# Patient Record
Sex: Male | Born: 1960 | Race: White | Hispanic: No | Marital: Married | Smoking: Former smoker
Health system: Southern US, Community
[De-identification: ages and names within clinical notes are randomized; demographics above are authoritative.]

## PROBLEM LIST (undated history)

## (undated) DIAGNOSIS — F329 Major depressive disorder, single episode, unspecified: Secondary | ICD-10-CM

## (undated) DIAGNOSIS — G249 Dystonia, unspecified: Secondary | ICD-10-CM

## (undated) DIAGNOSIS — I4510 Unspecified right bundle-branch block: Secondary | ICD-10-CM

## (undated) DIAGNOSIS — G4730 Sleep apnea, unspecified: Secondary | ICD-10-CM

## (undated) DIAGNOSIS — E669 Obesity, unspecified: Secondary | ICD-10-CM

## (undated) DIAGNOSIS — I10 Essential (primary) hypertension: Secondary | ICD-10-CM

## (undated) DIAGNOSIS — R251 Tremor, unspecified: Secondary | ICD-10-CM

## (undated) DIAGNOSIS — E559 Vitamin D deficiency, unspecified: Secondary | ICD-10-CM

## (undated) DIAGNOSIS — E119 Type 2 diabetes mellitus without complications: Secondary | ICD-10-CM

## (undated) DIAGNOSIS — M1990 Unspecified osteoarthritis, unspecified site: Secondary | ICD-10-CM

## (undated) DIAGNOSIS — F419 Anxiety disorder, unspecified: Secondary | ICD-10-CM

## (undated) DIAGNOSIS — R413 Other amnesia: Secondary | ICD-10-CM

## (undated) DIAGNOSIS — R269 Unspecified abnormalities of gait and mobility: Secondary | ICD-10-CM

## (undated) DIAGNOSIS — M76821 Posterior tibial tendinitis, right leg: Secondary | ICD-10-CM

## (undated) DIAGNOSIS — K9089 Other intestinal malabsorption: Secondary | ICD-10-CM

## (undated) DIAGNOSIS — M25871 Other specified joint disorders, right ankle and foot: Secondary | ICD-10-CM

## (undated) DIAGNOSIS — M5417 Radiculopathy, lumbosacral region: Secondary | ICD-10-CM

## (undated) DIAGNOSIS — E079 Disorder of thyroid, unspecified: Secondary | ICD-10-CM

## (undated) DIAGNOSIS — F32A Depression, unspecified: Secondary | ICD-10-CM

## (undated) DIAGNOSIS — K219 Gastro-esophageal reflux disease without esophagitis: Secondary | ICD-10-CM

## (undated) DIAGNOSIS — K909 Intestinal malabsorption, unspecified: Secondary | ICD-10-CM

## (undated) DIAGNOSIS — E785 Hyperlipidemia, unspecified: Secondary | ICD-10-CM

## (undated) DIAGNOSIS — D649 Anemia, unspecified: Secondary | ICD-10-CM

## (undated) HISTORY — PX: COLONOSCOPY: SHX174

## (undated) HISTORY — PX: ESOPHAGOGASTRODUODENOSCOPY: SHX1529

## (undated) HISTORY — PX: BACK SURGERY: SHX140

## (undated) HISTORY — PX: KNEE SURGERY: SHX244

## (undated) HISTORY — PX: CHOLECYSTECTOMY: SHX55

## (undated) HISTORY — PX: ANTERIOR FUSION CERVICAL SPINE: SUR626

---

## 2006-04-12 ENCOUNTER — Emergency Department: Payer: Self-pay | Admitting: Emergency Medicine

## 2006-08-04 ENCOUNTER — Emergency Department: Payer: Self-pay | Admitting: Emergency Medicine

## 2007-03-04 ENCOUNTER — Ambulatory Visit: Payer: Self-pay | Admitting: Gastroenterology

## 2007-03-16 ENCOUNTER — Ambulatory Visit: Payer: Self-pay | Admitting: Gastroenterology

## 2007-11-01 ENCOUNTER — Emergency Department: Payer: Self-pay | Admitting: Emergency Medicine

## 2009-04-14 ENCOUNTER — Ambulatory Visit: Payer: Self-pay | Admitting: Family Medicine

## 2010-02-27 ENCOUNTER — Ambulatory Visit: Payer: Self-pay | Admitting: Orthopedic Surgery

## 2010-10-19 ENCOUNTER — Ambulatory Visit: Payer: Self-pay

## 2010-11-09 ENCOUNTER — Ambulatory Visit: Payer: Self-pay

## 2010-12-09 ENCOUNTER — Ambulatory Visit: Payer: Self-pay

## 2011-06-27 ENCOUNTER — Ambulatory Visit: Payer: Self-pay | Admitting: General Practice

## 2011-06-27 LAB — CREATININE, SERUM: Creatinine: 1.92 mg/dL — ABNORMAL HIGH (ref 0.60–1.30)

## 2011-07-19 ENCOUNTER — Other Ambulatory Visit: Payer: Self-pay | Admitting: Gastroenterology

## 2011-07-19 LAB — CLOSTRIDIUM DIFFICILE BY PCR

## 2011-07-21 LAB — STOOL CULTURE

## 2011-07-25 LAB — WBCS, STOOL

## 2011-08-28 ENCOUNTER — Emergency Department: Payer: Self-pay | Admitting: Emergency Medicine

## 2011-09-03 ENCOUNTER — Ambulatory Visit: Payer: Self-pay | Admitting: Gastroenterology

## 2011-09-09 LAB — PATHOLOGY REPORT

## 2011-09-26 ENCOUNTER — Ambulatory Visit: Payer: Self-pay | Admitting: General Practice

## 2011-10-16 ENCOUNTER — Ambulatory Visit: Payer: Self-pay | Admitting: Gastroenterology

## 2011-10-16 LAB — CREATININE, SERUM
Creatinine: 1.1 mg/dL (ref 0.60–1.30)
EGFR (African American): >60
EGFR (Non-African Amer.): >60

## 2011-10-25 ENCOUNTER — Ambulatory Visit: Payer: Self-pay | Admitting: Oncology

## 2011-10-25 LAB — CBC CANCER CENTER
Basophil #: 0.1 x10 3/mm (ref 0.0–0.1)
Basophil %: 1.2 %
Eosinophil %: 6.1 %
HCT: 37.3 % — ABNORMAL LOW (ref 40.0–52.0)
Lymphocyte %: 29.6 %
MCHC: 32.6 g/dL (ref 32.0–36.0)
Monocyte #: 0.4 x10 3/mm (ref 0.2–1.0)
Neutrophil %: 56.3 %
RBC: 4.21 10*6/uL — ABNORMAL LOW (ref 4.40–5.90)
RDW: 13.8 % (ref 11.5–14.5)

## 2011-11-09 ENCOUNTER — Ambulatory Visit: Payer: Self-pay | Admitting: Oncology

## 2011-11-13 ENCOUNTER — Ambulatory Visit: Payer: Self-pay | Admitting: Gastroenterology

## 2011-12-16 ENCOUNTER — Ambulatory Visit: Payer: Self-pay | Admitting: Neurology

## 2011-12-17 ENCOUNTER — Encounter: Payer: Self-pay | Admitting: Neurology

## 2012-01-09 ENCOUNTER — Encounter: Payer: Self-pay | Admitting: Neurology

## 2012-02-09 ENCOUNTER — Encounter: Payer: Self-pay | Admitting: Neurology

## 2012-03-10 ENCOUNTER — Encounter: Payer: Self-pay | Admitting: Neurology

## 2012-08-23 ENCOUNTER — Emergency Department: Payer: Self-pay | Admitting: Unknown Physician Specialty

## 2013-05-01 ENCOUNTER — Emergency Department: Payer: Self-pay | Admitting: Emergency Medicine

## 2013-05-01 LAB — COMPREHENSIVE METABOLIC PANEL
Anion Gap: 7 (ref 7–16)
Bilirubin,Total: 0.4 mg/dL (ref 0.2–1.0)
Calcium, Total: 8.2 mg/dL — ABNORMAL LOW (ref 8.5–10.1)
Co2: 25 mmol/L (ref 21–32)
Creatinine: 1.31 mg/dL — ABNORMAL HIGH (ref 0.60–1.30)
EGFR (Non-African Amer.): >60
Glucose: 174 mg/dL — ABNORMAL HIGH (ref 65–99)
Osmolality: 281 (ref 275–301)
Potassium: 3.2 mmol/L — ABNORMAL LOW (ref 3.5–5.1)
SGOT(AST): 29 U/L (ref 15–37)
SGPT (ALT): 26 U/L (ref 12–78)
Sodium: 138 mmol/L (ref 136–145)

## 2013-05-01 LAB — PROTIME-INR
INR: 1
Prothrombin Time: 13 secs (ref 11.5–14.7)

## 2013-05-01 LAB — CBC
HCT: 36.5 % — ABNORMAL LOW (ref 40.0–52.0)
MCH: 29.8 pg (ref 26.0–34.0)
MCHC: 34.2 g/dL (ref 32.0–36.0)
RBC: 4.19 10*6/uL — ABNORMAL LOW (ref 4.40–5.90)
WBC: 3.6 10*3/uL — ABNORMAL LOW (ref 3.8–10.6)

## 2013-05-01 LAB — CK TOTAL AND CKMB (NOT AT ARMC): CK-MB: 2.6 ng/mL (ref 0.5–3.6)

## 2013-08-30 ENCOUNTER — Ambulatory Visit: Payer: Self-pay | Admitting: Oncology

## 2013-09-07 ENCOUNTER — Ambulatory Visit: Payer: Self-pay | Admitting: Oncology

## 2013-09-08 ENCOUNTER — Ambulatory Visit: Payer: Self-pay | Admitting: Oncology

## 2013-09-09 LAB — CBC CANCER CENTER
BASOS ABS: 0.1 x10 3/mm (ref 0.0–0.1)
BASOS PCT: 1.5 %
EOS ABS: 0.4 x10 3/mm (ref 0.0–0.7)
Eosinophil %: 7.7 %
HCT: 38.2 % — ABNORMAL LOW (ref 40.0–52.0)
HGB: 12.8 g/dL — ABNORMAL LOW (ref 13.0–18.0)
Lymphocyte #: 2.1 x10 3/mm (ref 1.0–3.6)
Lymphocyte %: 41.1 %
MCH: 29.5 pg (ref 26.0–34.0)
MCHC: 33.5 g/dL (ref 32.0–36.0)
MCV: 88 fL (ref 80–100)
MONO ABS: 0.4 x10 3/mm (ref 0.2–1.0)
Monocyte %: 8.3 %
Neutrophil #: 2.1 x10 3/mm (ref 1.4–6.5)
Neutrophil %: 41.4 %
Platelet: 227 x10 3/mm (ref 150–440)
RBC: 4.33 10*6/uL — ABNORMAL LOW (ref 4.40–5.90)
RDW: 14 % (ref 11.5–14.5)
WBC: 5 x10 3/mm (ref 3.8–10.6)

## 2013-09-09 LAB — FOLATE: Folic Acid: 32.9 ng/mL (ref 3.1–100.0)

## 2013-09-09 LAB — IRON AND TIBC
Iron Bind.Cap.(Total): 396 ug/dL (ref 250–450)
Iron Saturation: 22 %
Iron: 88 ug/dL (ref 65–175)
UNBOUND IRON-BIND. CAP.: 308 ug/dL

## 2013-09-09 LAB — LACTATE DEHYDROGENASE: LDH: 144 U/L (ref 85–241)

## 2013-09-09 LAB — FERRITIN: Ferritin (ARMC): 71 ng/mL (ref 8–388)

## 2013-09-15 ENCOUNTER — Ambulatory Visit: Payer: Self-pay | Admitting: Family Medicine

## 2013-10-08 ENCOUNTER — Ambulatory Visit: Payer: Self-pay | Admitting: Family Medicine

## 2013-10-08 ENCOUNTER — Ambulatory Visit: Payer: Self-pay | Admitting: Oncology

## 2014-01-11 ENCOUNTER — Ambulatory Visit: Payer: Self-pay | Admitting: Family Medicine

## 2014-04-18 ENCOUNTER — Ambulatory Visit: Payer: Self-pay | Admitting: Family Medicine

## 2014-07-29 ENCOUNTER — Ambulatory Visit: Payer: Self-pay | Admitting: Family Medicine

## 2017-02-07 ENCOUNTER — Ambulatory Visit
Admission: RE | Admit: 2017-02-07 | Discharge: 2017-02-07 | Disposition: A | Discharge status: Home / Self Care | Payer: Medicare Other | Source: Ambulatory Visit | Attending: Family Medicine | Admitting: Family Medicine

## 2017-02-07 ENCOUNTER — Other Ambulatory Visit: Payer: Self-pay | Admitting: Family Medicine

## 2017-02-07 DIAGNOSIS — J189 Pneumonia, unspecified organism: Secondary | ICD-10-CM | POA: Insufficient documentation

## 2017-02-07 DIAGNOSIS — R52 Pain, unspecified: Secondary | ICD-10-CM

## 2017-02-07 DIAGNOSIS — R059 Cough, unspecified: Secondary | ICD-10-CM

## 2017-02-07 DIAGNOSIS — J9811 Atelectasis: Secondary | ICD-10-CM | POA: Diagnosis not present

## 2017-02-07 DIAGNOSIS — R05 Cough: Secondary | ICD-10-CM | POA: Diagnosis present

## 2017-12-19 ENCOUNTER — Ambulatory Visit
Admission: RE | Admit: 2017-12-19 | Discharge: 2017-12-19 | Disposition: A | Discharge status: Home / Self Care | Payer: Medicare Other | Source: Ambulatory Visit | Attending: Family Medicine | Admitting: Family Medicine

## 2017-12-19 ENCOUNTER — Other Ambulatory Visit: Payer: Self-pay | Admitting: Family Medicine

## 2017-12-19 DIAGNOSIS — M25571 Pain in right ankle and joints of right foot: Secondary | ICD-10-CM | POA: Diagnosis present

## 2017-12-19 DIAGNOSIS — G8929 Other chronic pain: Secondary | ICD-10-CM | POA: Diagnosis present

## 2017-12-19 DIAGNOSIS — R52 Pain, unspecified: Secondary | ICD-10-CM

## 2018-03-20 ENCOUNTER — Encounter: Payer: Self-pay | Admitting: *Deleted

## 2018-03-23 ENCOUNTER — Encounter
Admission: RE | Disposition: A | Discharge status: Home / Self Care | Payer: Self-pay | Source: Ambulatory Visit | Attending: Gastroenterology

## 2018-03-23 ENCOUNTER — Ambulatory Visit: Payer: Medicare Other | Admitting: Anesthesiology

## 2018-03-23 ENCOUNTER — Encounter: Payer: Self-pay | Admitting: Anesthesiology

## 2018-03-23 ENCOUNTER — Ambulatory Visit
Admission: RE | Admit: 2018-03-23 | Discharge: 2018-03-23 | Disposition: A | Discharge status: Home / Self Care | Payer: Medicare Other | Source: Ambulatory Visit | Attending: Gastroenterology | Admitting: Gastroenterology

## 2018-03-23 DIAGNOSIS — K219 Gastro-esophageal reflux disease without esophagitis: Secondary | ICD-10-CM | POA: Diagnosis not present

## 2018-03-23 DIAGNOSIS — E119 Type 2 diabetes mellitus without complications: Secondary | ICD-10-CM | POA: Insufficient documentation

## 2018-03-23 DIAGNOSIS — M5417 Radiculopathy, lumbosacral region: Secondary | ICD-10-CM | POA: Insufficient documentation

## 2018-03-23 DIAGNOSIS — F419 Anxiety disorder, unspecified: Secondary | ICD-10-CM | POA: Diagnosis not present

## 2018-03-23 DIAGNOSIS — Z48815 Encounter for surgical aftercare following surgery on the digestive system: Secondary | ICD-10-CM | POA: Insufficient documentation

## 2018-03-23 DIAGNOSIS — E669 Obesity, unspecified: Secondary | ICD-10-CM | POA: Diagnosis not present

## 2018-03-23 DIAGNOSIS — G4730 Sleep apnea, unspecified: Secondary | ICD-10-CM | POA: Insufficient documentation

## 2018-03-23 DIAGNOSIS — Z6841 Body Mass Index (BMI) 40.0 and over, adult: Secondary | ICD-10-CM | POA: Insufficient documentation

## 2018-03-23 DIAGNOSIS — K5730 Diverticulosis of large intestine without perforation or abscess without bleeding: Secondary | ICD-10-CM | POA: Insufficient documentation

## 2018-03-23 DIAGNOSIS — I4510 Unspecified right bundle-branch block: Secondary | ICD-10-CM | POA: Diagnosis not present

## 2018-03-23 DIAGNOSIS — M1990 Unspecified osteoarthritis, unspecified site: Secondary | ICD-10-CM | POA: Diagnosis not present

## 2018-03-23 DIAGNOSIS — Z87891 Personal history of nicotine dependence: Secondary | ICD-10-CM | POA: Diagnosis not present

## 2018-03-23 DIAGNOSIS — I10 Essential (primary) hypertension: Secondary | ICD-10-CM | POA: Diagnosis not present

## 2018-03-23 DIAGNOSIS — Z794 Long term (current) use of insulin: Secondary | ICD-10-CM | POA: Insufficient documentation

## 2018-03-23 DIAGNOSIS — F329 Major depressive disorder, single episode, unspecified: Secondary | ICD-10-CM | POA: Diagnosis not present

## 2018-03-23 DIAGNOSIS — E785 Hyperlipidemia, unspecified: Secondary | ICD-10-CM | POA: Diagnosis not present

## 2018-03-23 DIAGNOSIS — Z86010 Personal history of colonic polyps: Secondary | ICD-10-CM | POA: Diagnosis not present

## 2018-03-23 DIAGNOSIS — E079 Disorder of thyroid, unspecified: Secondary | ICD-10-CM | POA: Diagnosis not present

## 2018-03-23 DIAGNOSIS — Z7982 Long term (current) use of aspirin: Secondary | ICD-10-CM | POA: Insufficient documentation

## 2018-03-23 HISTORY — DX: Other specified joint disorders, right ankle and foot: M25.871

## 2018-03-23 HISTORY — DX: Radiculopathy, lumbosacral region: M54.17

## 2018-03-23 HISTORY — DX: Anemia, unspecified: D64.9

## 2018-03-23 HISTORY — DX: Tremor, unspecified: R25.1

## 2018-03-23 HISTORY — DX: Anxiety disorder, unspecified: F41.9

## 2018-03-23 HISTORY — DX: Sleep apnea, unspecified: G47.30

## 2018-03-23 HISTORY — DX: Intestinal malabsorption, unspecified: K90.9

## 2018-03-23 HISTORY — DX: Gastro-esophageal reflux disease without esophagitis: K21.9

## 2018-03-23 HISTORY — DX: Unspecified abnormalities of gait and mobility: R26.9

## 2018-03-23 HISTORY — DX: Vitamin D deficiency, unspecified: E55.9

## 2018-03-23 HISTORY — DX: Type 2 diabetes mellitus without complications: E11.9

## 2018-03-23 HISTORY — DX: Disorder of thyroid, unspecified: E07.9

## 2018-03-23 HISTORY — DX: Essential (primary) hypertension: I10

## 2018-03-23 HISTORY — DX: Depression, unspecified: F32.A

## 2018-03-23 HISTORY — DX: Major depressive disorder, single episode, unspecified: F32.9

## 2018-03-23 HISTORY — DX: Other amnesia: R41.3

## 2018-03-23 HISTORY — DX: Other intestinal malabsorption: K90.89

## 2018-03-23 HISTORY — DX: Unspecified right bundle-branch block: I45.10

## 2018-03-23 HISTORY — DX: Posterior tibial tendinitis, right leg: M76.821

## 2018-03-23 HISTORY — PX: COLONOSCOPY WITH PROPOFOL: SHX5780

## 2018-03-23 HISTORY — DX: Obesity, unspecified: E66.9

## 2018-03-23 HISTORY — DX: Unspecified osteoarthritis, unspecified site: M19.90

## 2018-03-23 HISTORY — DX: Hyperlipidemia, unspecified: E78.5

## 2018-03-23 HISTORY — DX: Dystonia, unspecified: G24.9

## 2018-03-23 LAB — GLUCOSE, CAPILLARY: GLUCOSE-CAPILLARY: 149 mg/dL — AB (ref 70–99)

## 2018-03-23 SURGERY — COLONOSCOPY WITH PROPOFOL
Anesthesia: General

## 2018-03-23 MED ORDER — GLYCOPYRROLATE 0.2 MG/ML IJ SOLN
INTRAMUSCULAR | Status: AC
  Filled 2018-03-23: qty 1

## 2018-03-23 MED ORDER — FENTANYL CITRATE (PF) 100 MCG/2ML IJ SOLN
INTRAMUSCULAR | Status: DC | PRN
  Administered 2018-03-23: 25 ug via INTRAVENOUS
  Administered 2018-03-23: 50 ug via INTRAVENOUS
  Administered 2018-03-23: 25 ug via INTRAVENOUS

## 2018-03-23 MED ORDER — GLYCOPYRROLATE 0.2 MG/ML IJ SOLN
INTRAMUSCULAR | Status: DC | PRN
  Administered 2018-03-23: 0.1 mg via INTRAVENOUS

## 2018-03-23 MED ORDER — MIDAZOLAM HCL 2 MG/2ML IJ SOLN
INTRAMUSCULAR | Status: AC
  Filled 2018-03-23: qty 2

## 2018-03-23 MED ORDER — PROPOFOL 500 MG/50ML IV EMUL
INTRAVENOUS | Status: AC
  Filled 2018-03-23: qty 50

## 2018-03-23 MED ORDER — MIDAZOLAM HCL 2 MG/2ML IJ SOLN
INTRAMUSCULAR | Status: DC | PRN
  Administered 2018-03-23: 2 mg via INTRAVENOUS

## 2018-03-23 MED ORDER — FENTANYL CITRATE (PF) 100 MCG/2ML IJ SOLN
INTRAMUSCULAR | Status: AC
  Filled 2018-03-23: qty 2

## 2018-03-23 MED ORDER — PROPOFOL 500 MG/50ML IV EMUL
INTRAVENOUS | Status: DC | PRN
  Administered 2018-03-23: 140 ug/kg/min via INTRAVENOUS

## 2018-03-23 MED ORDER — SODIUM CHLORIDE 0.9 % IV SOLN
INTRAVENOUS | Status: DC
  Administered 2018-03-23: 13:00:00 via INTRAVENOUS

## 2018-03-23 MED ORDER — SODIUM CHLORIDE 0.9 % IV SOLN: INTRAVENOUS | Status: DC

## 2018-03-23 NOTE — Anesthesia Post-op Follow-up Note (Signed)
Anesthesia QCDR form completed.        

## 2018-03-23 NOTE — Anesthesia Preprocedure Evaluation (Signed)
Anesthesia Evaluation  Patient identified by MRN, date of birth, ID band Patient awake    Reviewed: Allergy & Precautions, NPO status , Patient's Chart, lab work & pertinent test results  History of Anesthesia Complications Negative for: history of anesthetic complications  Airway Mallampati: III  TM Distance: >3 FB Neck ROM: Full    Dental no notable dental hx.    Pulmonary sleep apnea , neg COPD, former smoker,    breath sounds clear to auscultation- rhonchi (-) wheezing      Cardiovascular hypertension, Pt. 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 No date: Depression No date: Diabetes mellitus without complication (HCC) No date: Dyskinesia No date: GERD (gastroesophageal reflux disease) No date: Hyperlipidemia No date: Hypertension No date: Memory loss No date: Obesity No date: Posterior tibial tendon dysfunction (PTTD) of right lower  extremity No date: Radiculopathy, lumbosacral region No date: RBBB No date: Sleep apnea No date: Subfibular impingement of right lower extremity No date: Thyroid disease No date: Tremor No date: Vitamin D deficiency   Reproductive/Obstetrics                             Anesthesia Physical Anesthesia Plan  ASA:  III  Anesthesia Plan: General   Post-op Pain Management:    Induction: Intravenous  PONV Risk Score and Plan: 1 and Propofol infusion  Airway Management Planned: Natural Airway  Additional Equipment:   Intra-op Plan:   Post-operative Plan:   Informed Consent: I have reviewed the patients History and Physical, chart, labs and discussed the procedure including the risks, benefits and alternatives for the proposed anesthesia with the patient or authorized representative who has indicated his/her understanding and acceptance.   Dental advisory given  Plan Discussed with: CRNA and Anesthesiologist  Anesthesia Plan Comments:         Anesthesia Quick Evaluation

## 2018-03-23 NOTE — Anesthesia Postprocedure Evaluation (Signed)
Anesthesia Post Note  Patient: Dylan Wright  Procedure(s) Performed: COLONOSCOPY WITH PROPOFOL (N/A )  Patient location during evaluation: Endoscopy Anesthesia Type: General Level of consciousness: awake and alert and oriented Pain management: pain level controlled Vital Signs Assessment: post-procedure vital signs reviewed and stable Respiratory status: spontaneous breathing, nonlabored ventilation and respiratory function stable Cardiovascular status: blood pressure returned to baseline and stable Postop Assessment: no signs of nausea or vomiting Anesthetic complications: no     Last Vitals:  Vitals:   03/23/18 1400 03/23/18 1410  BP: (!) 141/83 140/82  Pulse: (!) 46 (!) 44  Resp: 14 18  Temp:    SpO2: 97% 93%    Last Pain:  Vitals:   03/23/18 1410  TempSrc:   PainSc: 0-No pain                 Brittin Janik

## 2018-03-23 NOTE — Op Note (Signed)
[facility] Gastroenterology Patient Name: Dylan Wright Procedure Date: 03/23/2018 12:39 PM MRN: [id number] Account #: [id number] Date of Birth: 06-13-1960 Admit Type: Outpatient Age: 57 Room: [facility] ENDO ROOM 1 Gender: Male Note Status: Finalized Procedure:            Colonoscopy Indications:          Personal history of colonic polyps Providers:            Christena Deem, MD Referring MD:         No Local Md, MD (Referring MD) Medicines:            Monitored Anesthesia Care Complications:        No immediate complications. Procedure:            Pre-Anesthesia Assessment:                       - ASA Grade Assessment: III - A patient with severe                        systemic disease.                       After obtaining informed consent, the colonoscope was                        passed under direct vision. Throughout the procedure,                        the patient's blood pressure, pulse, and oxygen                        saturations were monitored continuously. The                        Colonoscope was introduced through the anus and                        advanced to the the cecum, identified by appendiceal                        orifice and ileocecal valve. The colonoscopy was                        unusually difficult due to significant looping.                        Successful completion of the procedure was aided by                        changing the patient to a supine position, changing the                        patient to a prone position and using manual pressure.                        The patient tolerated the procedure well. The quality                        of the bowel preparation was fair. Findings:      The sigmoid colon, descending colon and transverse  colon were       significantly redundant.      Multiple small and large-mouthed diverticula were found in the sigmoid       colon and descending colon.      The retroflexed view of  the distal rectum and anal verge was normal and       showed no anal or rectal abnormalities.      The exam was otherwise without abnormality.      The digital rectal exam was normal. Impression:           - Preparation of the colon was fair.                       - Redundant colon.                       - Diverticulosis in the sigmoid colon and in the                        descending colon.                       - The distal rectum and anal verge are normal on                        retroflexion view.                       - The examination was otherwise normal.                       - No specimens collected. Recommendation:       - Discharge patient to home. Procedure Code(s):    --- Professional ---                       [phone], Colonoscopy, flexible; diagnostic, including                        collection of specimen(s) by brushing or washing, when                        performed (separate procedure) Diagnosis Code(s):    --- Professional ---                       Z86.010, Personal history of colonic polyps                       K57.30, Diverticulosis of large intestine without                        perforation or abscess without bleeding                       Q43.8, Other specified congenital malformations of                        intestine CPT copyright 2018 [employer]. All rights reserved. The codes documented in this report are preliminary and upon coder review may  be revised to meet current compliance requirements. Christena Deem, MD 03/23/2018 1:44:12 PM This report has been signed electronically. Number of Addenda: 0 Note Initiated On: 03/23/2018 12:39 PM Scope  Withdrawal Time: 0 hours 8 minutes 53 seconds  Total Procedure Duration: 0 hours 33 minutes 41 seconds       [facility]

## 2018-03-23 NOTE — H&P (Signed)
Outpatient short stay form Pre-procedure 03/23/2018 12:38 PM Dylan Deem MD  Primary Physician: Dr. Lucy Chris  Reason for visit: Colonoscopy  History of present illness: Patient is a 57 year old male presenting today as above.  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  He has a personal history of adenomatous colon polyps.  Tolerated his prep well.  He takes no blood thinning agent with the exception of an 81 mg aspirin that is been held for about a week.    No current facility-administered medications for this encounter.   Medications Prior to Admission  Medication Sig Dispense Refill Last Dose  . aspirin EC 81 MG tablet Take 81 mg by mouth daily.   Past Week at Unknown time  . atorvastatin (LIPITOR) 40 MG tablet Take 40 mg by mouth daily.   Past Week at Unknown time  . candesartan (ATACAND) 32 MG tablet Take 32 mg by mouth daily.   03/23/2018 at 0600  . cholecalciferol (VITAMIN D) 400 units TABS tablet Take 2,000 Units by mouth daily.   03/22/2018 at Unknown time  . cholestyramine light (PREVALITE) 4 g packet Take 4 g by mouth 2 (two) times daily.   Past Week at Unknown time  . cloNIDine (CATAPRES) 0.1 MG tablet Take 0.1 mg by mouth 2 (two) times daily.   03/23/2018 at 0600  . cyclobenzaprine (FLEXERIL) 10 MG tablet Take 10 mg by mouth at bedtime.   Past Month at Unknown time  . escitalopram (LEXAPRO) 20 MG tablet Take 20 mg by mouth daily.   03/22/2018 at Unknown time  . gabapentin (NEURONTIN) 400 MG capsule Take 800 mg by mouth at bedtime.   03/22/2018 at Unknown time  . glipiZIDE (GLUCOTROL XL) 10 MG 24 hr tablet Take 10 mg by mouth daily with breakfast.   03/22/2018 at Unknown time  . insulin lispro (HUMALOG) 100 UNIT/ML injection Inject 22 Units into the skin 3 (three) times daily with meals.   03/22/2018 at Unknown time  . insulin NPH Human (HUMULIN N,NOVOLIN N) 100 UNIT/ML injection Inject 80 Units into the skin 2 (two) times daily before a meal.    03/23/2018 at 0600  . Insulin Syringe-Needle U-100 31G X 5/16" 1 ML MISC by Does not apply route as directed.     Marland Kitchen levothyroxine (SYNTHROID, LEVOTHROID) 175 MCG tablet Take 175 mcg by mouth daily before breakfast.   03/22/2018 at Unknown time  . magnesium oxide (MAG-OX) 400 MG tablet Take 400 mg by mouth daily.   03/22/2018 at Unknown time  . metFORMIN (GLUCOPHAGE) 1000 MG tablet Take 1,000 mg by mouth 2 (two) times daily with a meal.   Past Week at Unknown time  . omeprazole (PRILOSEC) 40 MG capsule Take 40 mg by mouth daily.   03/22/2018 at Unknown time  . triamterene-hydrochlorothiazide (DYAZIDE) 37.5-25 MG capsule Take 1 capsule by mouth daily.   03/22/2018 at Unknown time  . Multiple Vitamin (MULTIVITAMIN) tablet Take 1 tablet by mouth daily.   Not Taking at Unknown time     Allergies  Allergen Reactions  . Victoza [Liraglutide] Nausea Only     Past Medical History:  Diagnosis Date  . Abnormality of gait and mobility   . Anemia   . Anxiety   . Arthritis    OSTEOARTHRITIS OF SPINE-CERVICAL REGION  . Bile acid malabsorption syndrome   . Depression   . Diabetes mellitus without complication (HCC)   . Dyskinesia   . GERD (gastroesophageal reflux disease)   .  Hyperlipidemia   . Hypertension   . Memory loss   . Obesity   . Posterior tibial tendon dysfunction (PTTD) of right lower extremity   . Radiculopathy, lumbosacral region   . RBBB   . Sleep apnea   . Subfibular impingement of right lower extremity   . Thyroid disease   . Tremor   . Vitamin D deficiency     Review of systems:      Physical Exam    Heart and lungs: Regular rate and rhythm without rub or gallop, lungs are bilaterally clear.    HEENT: Normocephalic atraumatic eyes are anicteric    Other:     Pertinant exam for procedure: Soft nontender nondistended bowel sounds positive normoactive    Planned proceedures: Colonoscopy and indicated procedures. I have discussed the risks benefits and  complications of procedures to include not limited to bleeding, infection, perforation and the risk of sedation and the patient wishes to proceed.    Dylan Deem, MD Gastroenterology 03/23/2018  12:38 PM

## 2018-03-23 NOTE — Transfer of Care (Signed)
Immediate Anesthesia Transfer of Care Note  Patient: Dylan Wright  Procedure(s) Performed: COLONOSCOPY WITH PROPOFOL (N/A )  Patient Location: PACU  Anesthesia Type:General  Level of Consciousness: awake and sedated  Airway & Oxygen Therapy: Patient Spontanous Breathing and Patient connected to nasal cannula oxygen  Post-op Assessment: Report given to RN and Post -op Vital signs reviewed and stable  Post vital signs: Reviewed and stable  Last Vitals:  Vitals Value Taken Time  BP    Temp    Pulse    Resp    SpO2      Last Pain:  Vitals:   03/23/18 1235  TempSrc: Tympanic  PainSc: 0-No pain         Complications: No apparent anesthesia complications

## 2018-03-23 NOTE — Anesthesia Procedure Notes (Signed)
Performed by: Cook-Martin, Lennie Dunnigan °Pre-anesthesia Checklist: Patient identified, Emergency Drugs available, Suction available, Patient being monitored and Timeout performed °Patient Re-evaluated:Patient Re-evaluated prior to induction °Oxygen Delivery Method: Simple face mask °Preoxygenation: Pre-oxygenation with 100% oxygen °Induction Type: IV induction °Placement Confirmation: positive ETCO2 and CO2 detector ° ° ° ° ° ° °

## 2019-03-17 ENCOUNTER — Encounter: Payer: Medicare Other | Attending: Internal Medicine | Admitting: Internal Medicine

## 2019-03-17 ENCOUNTER — Other Ambulatory Visit: Payer: Self-pay

## 2019-03-17 ENCOUNTER — Ambulatory Visit
Admission: RE | Admit: 2019-03-17 | Discharge: 2019-03-17 | Disposition: A | Discharge status: Home / Self Care | Payer: Medicare Other | Source: Ambulatory Visit | Attending: Internal Medicine | Admitting: Internal Medicine

## 2019-03-17 ENCOUNTER — Other Ambulatory Visit: Payer: Self-pay | Admitting: Internal Medicine

## 2019-03-17 ENCOUNTER — Other Ambulatory Visit
Admission: RE | Admit: 2019-03-17 | Discharge: 2019-03-17 | Disposition: A | Discharge status: Home / Self Care | Payer: Medicare Other | Source: Ambulatory Visit | Attending: Internal Medicine | Admitting: Internal Medicine

## 2019-03-17 DIAGNOSIS — E1122 Type 2 diabetes mellitus with diabetic chronic kidney disease: Secondary | ICD-10-CM | POA: Insufficient documentation

## 2019-03-17 DIAGNOSIS — B999 Unspecified infectious disease: Secondary | ICD-10-CM | POA: Diagnosis present

## 2019-03-17 DIAGNOSIS — N183 Chronic kidney disease, stage 3 unspecified: Secondary | ICD-10-CM | POA: Diagnosis not present

## 2019-03-17 DIAGNOSIS — L03116 Cellulitis of left lower limb: Secondary | ICD-10-CM | POA: Insufficient documentation

## 2019-03-17 DIAGNOSIS — E11622 Type 2 diabetes mellitus with other skin ulcer: Secondary | ICD-10-CM | POA: Diagnosis present

## 2019-03-17 DIAGNOSIS — G8929 Other chronic pain: Secondary | ICD-10-CM | POA: Insufficient documentation

## 2019-03-17 DIAGNOSIS — I129 Hypertensive chronic kidney disease with stage 1 through stage 4 chronic kidney disease, or unspecified chronic kidney disease: Secondary | ICD-10-CM | POA: Diagnosis not present

## 2019-03-17 NOTE — Progress Notes (Signed)
NICCOLAS, LOEPER ([id number]) Visit Report for 03/17/2019 Abuse/Suicide Risk Screen Details Patient Name: Dylan Wright, Dylan Wright. Date of Service: 03/17/2019 1:15 PM Medical Record Number: [id number] Patient Account Number: [id number] Date of Birth/Sex: January 16, 1961 (58 y.o. M) Treating RN: Rodell Perna Primary Care Joanathan Affeldt: Lucy Chris Other Clinician: Referring Kayly Kriegel: Eddie Candle Treating Nyaja [place]/Extender: [place]ames[place] in Treatment: 0 Abuse/Suicide Risk Screen Items Answer ABUSE RISK SCREEN: Has anyone close to you tried to hurt or harm you recentlyo No Do you feel uncomfortable with anyone in your familyo No Has anyone forced you do things that you didnot want to doo No Electronic Signature(s) Signed: 03/17/2019 4:09:57 PM By: Rodell Perna Entered By: Rodell Perna on 03/17/2019 13:31:42 Dylan Wright ([id number]) -------------------------------------------------------------------------------- Activities of Daily Living Details Patient Name: Dylan Wright, Dylan Wright. Date of Service: 03/17/2019 1:15 PM Medical Record Number: [id number] Patient Account Number: [id number] Date of Birth/Sex: May 29, 1961 (58 y.o. M) Treating RN: Rodell Perna Primary Care Anneliese Leblond: Lucy Chris Other Clinician: Referring Aydden Cumpian: Eddie Candle Treating Kunaal Walkins/Extender: Altamese [place] in Treatment: 0 Activities of Daily Living Items Answer Activities of Daily Living (Please select one for each item) Drive Automobile Completely Able Take Medications Completely Able Use Telephone Completely Able Care for Appearance Completely Able Use Toilet Completely Able Bath / Shower Completely Able Dress Self Completely Able Feed Self Completely Able Walk Completely Able Get In / Out Bed Completely Able Housework Completely Able Prepare Meals Completely Able Handle Money Completely Able Shop for Self Completely Able Electronic Signature(s) Signed: 03/17/2019 4:09:57 PM By: Rodell Perna Entered  By: Rodell Perna on 03/17/2019 13:31:54 Dylan Wright ([id number]) -------------------------------------------------------------------------------- Education Screening Details Patient Name: Dylan Wright, Dylan Wright. Date of Service: 03/17/2019 1:15 PM Medical Record Number: [id number] Patient Account Number: [id number] Date of Birth/Sex: 10-Jan-1961 (58 y.o. M) Treating RN: Rodell Perna Primary Care Lekita Kerekes: Lucy Chris Other Clinician: Referring Ceejay Kegley: Eddie Candle Treating Billey Wojciak/Extender: Altamese [place] in Treatment: 0 Learning Preferences/Education Level/Primary Language Learning Preference: Explanation, Demonstration, Video Highest Education Level: College or Above Preferred Language: English Cognitive Barrier Language Barrier: No Translator Needed: No Memory Deficit: No Emotional Barrier: No Cultural/Religious Beliefs Affecting Medical Care: No Physical Barrier Impaired Vision: No Impaired Hearing: No Decreased Hand dexterity: No Knowledge/Comprehension Knowledge Level: High Comprehension Level: High Ability to understand written High instructions: Ability to understand verbal High instructions: Motivation Anxiety Level: Calm Cooperation: Cooperative Education Importance: Acknowledges Need Interest in Health Problems: Asks Questions Perception: Coherent Willingness to Engage in Self- High Management Activities: Readiness to Engage in Self- High Management Activities: Electronic Signature(s) Signed: 03/17/2019 4:09:57 PM By: Rodell Perna Entered By: Rodell Perna on 03/17/2019 13:32:12 Dylan Wright, Dylan Wright ([id number]) -------------------------------------------------------------------------------- Fall Risk Assessment Details Patient Name: Dylan Wright. Date of Service: 03/17/2019 1:15 PM Medical Record Number: [id number] Patient Account Number: [id number] Date of Birth/Sex: September 09, 1960 (58 y.o. M) Treating RN: Rodell Perna Primary Care Candace Ramus: Lucy Chris Other Clinician: Referring Mardy Hoppe: Eddie Candle Treating Renne Platts/Extender: Altamese [place] in Treatment: 0 Fall Risk Assessment Items Have you had 2 or more falls in the last 12 monthso 0 No Have you had any fall that resulted in injury in the last 12 monthso 0 No FALLS RISK SCREEN History of falling - immediate or within 3 months 0 No Secondary diagnosis (Do you have 2 or more medical diagnoseso) 0 No Ambulatory aid None/bed rest/wheelchair/nurse 0 No Crutches/cane/walker 0 No Furniture 0 No Intravenous therapy Access/Saline/Heparin Lock 0 No Gait/Transferring Normal/ bed rest/ wheelchair 0 No Weak (short steps  with or without shuffle, stooped but able to lift head while 0 No walking, may seek support from furniture) Impaired (short steps with shuffle, may have difficulty arising from chair, head 0 No down, impaired balance) Mental Status Oriented to own ability 0 No Electronic Signature(s) Signed: 03/17/2019 4:09:57 PM By: Army Melia Entered By: Army Melia on 03/17/2019 13:32:21 Dylan Wright ([id number]) -------------------------------------------------------------------------------- Foot Assessment Details Patient Name: Dylan Wright, Dylan Wright. Date of Service: 03/17/2019 1:15 PM Medical Record Number: [id number] Patient Account Number: [id number] Date of Birth/Sex: [birth year] (58 y.o. M) Treating RN: Army Melia Primary Care Heaven Meeker: Mcneil Sober Other Clinician: Referring Tenille Morrill: Colbert Ewing Treating Jeanine Caven/Extender: Tito Dine in Treatment: 0 Foot Assessment Items Site Locations + = Sensation present, - = Sensation absent, C = Callus, U = Ulcer R = Redness, W = Warmth, M = Maceration, PU = Pre-ulcerative lesion F = Fissure, S = Swelling, D = Dryness Assessment Right: Left: Other Deformity: No No Prior Foot Ulcer: No No Prior Amputation: No No Charcot Joint: No No Ambulatory Status: Ambulatory With Help Assistance Device:  Cane Gait: Steady Electronic Signature(s) Signed: 03/17/2019 4:09:57 PM By: Army Melia Entered By: Army Melia on 03/17/2019 13:33:22 Dylan Wright ([id number]) -------------------------------------------------------------------------------- Nutrition Risk Screening Details Patient Name: Dylan Wright. Date of Service: 03/17/2019 1:15 PM Medical Record Number: [id number] Patient Account Number: [id number] Date of Birth/Sex: 1960-12-11 (58 y.o. M) Treating RN: Army Melia Primary Care Keiston Manley: Mcneil Sober Other Clinician: Referring Raniyah Curenton: Colbert Ewing Treating Simonne Boulos/Extender: Tito Dine in Treatment: 0 Height (in): 75 Weight (lbs): 320 Body Mass Index (BMI): 40 Nutrition Risk Screening Items Score Screening NUTRITION RISK SCREEN: I have an illness or condition that made me change the kind and/or amount of 0 No food I eat I eat fewer than two meals per day 0 No I eat few fruits and vegetables, or milk products 0 No I have three or more drinks of beer, liquor or wine almost every day 0 No I have tooth or mouth problems that make it hard for me to eat 0 No I don't always have enough money to buy the food I need 0 No I eat alone most of the time 0 No I take three or more different prescribed or over-the-counter drugs a day 0 No Without wanting to, I have lost or gained 10 pounds in the last six months 0 No I am not always physically able to shop, cook and/or feed myself 0 No Nutrition Protocols Good Risk Protocol 0 No interventions needed Moderate Risk Protocol High Risk Proctocol Risk Level: Good Risk Score: 0 Electronic Signature(s) Signed: 03/17/2019 4:09:57 PM By: Army Melia Entered By: Army Melia on 03/17/2019 13:32:27

## 2019-03-17 NOTE — Progress Notes (Signed)
LEJON, AFZAL ([id number]) Visit Report for 03/17/2019 Chief Complaint Document Details Patient Name: Dylan Wright, Dylan Wright. Date of Service: 03/17/2019 1:15 PM Medical Record Number: [id number] Patient Account Number: [id number] Date of Birth/Sex: 1960-11-30 (58 y.o. M) Treating RN: Huel Coventry Primary Care Provider: Lucy Chris Other Clinician: Referring Provider: Eddie Candle Treating Provider/Extender: Altamese [place] in Treatment: 0 Information Obtained from: Patient Chief Complaint 03/17/2019; patient is here for review of a traumatic wound to his left lower shin x1 month Electronic Signature(s) Signed: 03/17/2019 5:14:46 PM By: Baltazar Najjar MD Entered By: Baltazar Najjar on 03/17/2019 14:08:18 Dylan Wright ([id number]) -------------------------------------------------------------------------------- Debridement Details Patient Name: Dylan Wright, Dylan Wright. Date of Service: 03/17/2019 1:15 PM Medical Record Number: [id number] Patient Account Number: [id number] Date of Birth/Sex: Dec 09, 1960 (58 y.o. M) Treating RN: Huel Coventry Primary Care Provider: Lucy Chris Other Clinician: Referring Provider: Eddie Candle Treating Provider/Extender: Altamese [place] in Treatment: 0 Debridement Performed for Wound #1 Left,Anterior Lower Leg Assessment: Performed By: Physician Maxwell Caul, MD Debridement Type: Debridement Severity of Tissue Pre Fat layer exposed Debridement: Level of Consciousness (Pre- Awake and Alert procedure): Pre-procedure Verification/Time Yes - 13:50 Out Taken: Start Time: 13:50 Pain Control: Lidocaine Total Area Debrided (L x W): 1.1 (cm) x 0.8 (cm) = 0.88 (cm) Tissue and other material Viable, Non-Viable, Slough, Subcutaneous, Slough debrided: Level: Skin/Subcutaneous Tissue Debridement Description: Excisional Instrument: Curette Bleeding: Moderate Hemostasis Achieved: Pressure End Time: 13:55 Response to Treatment: Procedure was  tolerated well Level of Consciousness Awake and Alert (Post-procedure): Post Debridement Measurements of Total Wound Length: (cm) 1.1 Width: (cm) 0.8 Depth: (cm) 1 Volume: (cm) 0.691 Character of Wound/Ulcer Post Debridement: Requires Further Debridement Severity of Tissue Post Debridement: Fat layer exposed Post Procedure Diagnosis Same as Pre-procedure Electronic Signature(s) Signed: 03/17/2019 5:14:46 PM By: Baltazar Najjar MD Signed: 03/17/2019 5:19:46 PM By: Elliot Gurney, BSN, RN, CWS, Kim RN, BSN Entered By: Baltazar Najjar on 03/17/2019 14:07:46 Dylan Wright ([id number]) -------------------------------------------------------------------------------- HPI Details Patient Name: Dylan Wright, Dylan Wright. Date of Service: 03/17/2019 1:15 PM Medical Record Number: [id number] Patient Account Number: [id number] Date of Birth/Sex: November 26, 1960 (58 y.o. M) Treating RN: Huel Coventry Primary Care Provider: Lucy Chris Other Clinician: Referring Provider: Eddie Candle Treating Provider/Extender: Altamese [place] in Treatment: 0 History of Present Illness HPI Description: ADMISSION 03/17/2019 This is a 58 year old diabetic man who was cutting his lawn of roughly a month ago. A small rock spun out and hit him in the left lower leg. There was immediate superficial injury. He said the pain by that night was quite significant. Saw his primary doctor's office at [facility] clinic in [place] apparently a culture was done and the patient was prescribed 15 days of doxycycline which he just finished 3 days ago. This is not made any difference. There is a fair amount of drainage still tenderness and aching pain. He has been only covering this with a Band-Aid. 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 Date of Service: 03/17/2019 1:15 PM Medical Record Number: [id number] Patient Account Number: [id number] Date of Birth/Sex: Oct 02, 1960 (58 y.o. M) Treating RN: Huel Coventry  Primary Care Provider: Lucy Chris Other Clinician: Referring Provider: Eddie Candle Treating Provider/Extender: Altamese [place] in Treatment: 0 Constitutional Patient is hypertensive.. Pulse regular and within target range for patient.Marland Kitchen Respirations regular, non-labored and within target range.. Temperature is normal and within the target range for the patient.Marland Kitchen appears in no distress. Respiratory Respiratory effort is easy and symmetric bilaterally. Rate is normal at rest and on room air.. Bilateral breath sounds are clear and equal in all lobes with no wheezes, rales or rhonchi.. Cardiovascular Heart rhythm and rate regular, without murmur or gallop.. Pedal pulses were normal on the left. Thing abnormal in the left leg. No evidence of venous or arterial disease. Lymphatic None palpable in the popliteal area. Integumentary (Hair, Skin) Some erythema around the wound. Marked tenderness superiorly and lateral to the area in question.Marland Kitchen Psychiatric No evidence of depression, anxiety, or agitation. Calm, cooperative, and communicative. Appropriate interactions and affect.. Notes Wound exam; the area in question is a small open area on the lower left pre-tibia. Necrotic debris around the small opening. The small opening probes to bone. There is a clear drainage from this. Marked tenderness superiorly and laterally to the wound but there is no soft tissue crepitus. Electronic  Signature(s) Signed: 03/17/2019 5:14:46 PM By: Baltazar Najjar MD Entered By: Baltazar Najjar on 03/17/2019 14:12:12 Dylan Wright ([id number]) -------------------------------------------------------------------------------- Physician Orders Details Patient Name: Dylan Wright, Dylan Wright. Date of Service: 03/17/2019 1:15 PM Medical Record Number: [id number] Patient Account Number: [id number] Date of Birth/Sex: 01-02-1961 (58 y.o. M) Treating RN: Huel Coventry Primary Care Provider: Lucy Chris Other Clinician: Referring Provider: Eddie Candle Treating Provider/Extender: Altamese [place] in Treatment: 0 Verbal / Phone Orders: No Diagnosis Coding Wound Cleansing Wound #1 Left,Anterior Lower Leg o May Shower, gently pat wound dry prior to applying new dressing. Anesthetic (add to Medication List) Wound #1 Left,Anterior Lower Leg o Topical Lidocaine 4% cream applied to wound bed prior to debridement (In Clinic Only). Primary Wound Dressing Wound #1 Left,Anterior Lower Leg o Silver Alginate - Pack strip lightly into wound Secondary Dressing Wound #1 Left,Anterior Lower Leg o Boardered Foam Dressing Dressing Change Frequency Wound #1 Left,Anterior Lower Leg o Change dressing every day. Follow-up Appointments Wound #1 Left,Anterior Lower Leg o Return Appointment in 1 week. Edema Control Wound #1 Left,Anterior Lower Leg o Elevate legs to the level of the heart and pump ankles as often as possible Laboratory o Bacteria identified in Wound by Culture (MICRO) - left lower leg oooo LOINC Code: 6462-6 oooo Convenience Name: Wound culture routine Radiology o X-ray, lower leg - Left Tib/Fib Services and Therapies o DME provider, dressing supplies Dylan Wright, Dylan Wright ([id number]) Electronic Signature(s) Signed: 03/17/2019 5:14:46 PM By: Baltazar Najjar MD Signed: 03/17/2019 5:19:46 PM By: Elliot Gurney, BSN, RN, CWS, Kim RN, BSN Entered By: Elliot Gurney, BSN, RN, CWS, Kim on 03/17/2019  14:00:44 Dylan Wright ([id number]) -------------------------------------------------------------------------------- Problem List Details Patient Name: Dylan Wright, Dylan Wright. Date of Service: 03/17/2019 1:15 PM Medical Record Number: [id number] Patient Account Number: [id number] Date of Birth/Sex: 04/14/1961 (58 y.o. 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 Date of Service: 03/17/2019 1:15 PM Medical Record Number: [id number] Patient Account Number: [id number] Date of Birth/Sex: 10-05-1960 (58 y.o. M) Treating RN: Huel Coventry Primary Care Provider: Lucy Chris Other Clinician: Referring Provider: Eddie Candle Treating Provider/Extender: Altamese [place] in Treatment: 0 Subjective Chief Complaint Information obtained from Patient 03/17/2019; patient is here for review of a traumatic wound to his left lower shin x1 month History of Present Illness (HPI) ADMISSION 03/17/2019 This is a 58 year old diabetic man who was cutting his lawn of roughly a month ago. A small rock spun out and hit him in the left lower  leg. There was immediate superficial injury. He said the pain by that night was quite significant. Saw his primary doctor's office at [facility] clinic in [place] apparently a culture was done and the patient was prescribed 15 days of doxycycline which he just finished 3 days ago. This is not made any difference. There is a fair amount of drainage still tenderness and aching pain. He has been only covering this with a Band-Aid. 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 Allergies Cipro (Reaction: palpitations), Victoza (Reaction: nausea), Darvocet-N Family History Cancer - Mother,Maternal Grandparents, Diabetes - Mother,Father,Maternal Grandparents,Paternal Grandparents,Siblings, Heart Disease - Father, Hypertension - Mother,Father, Stroke - Mother, Thyroid Problems - Father,Mother, No family history of Hereditary Spherocytosis, Kidney Disease, Lung Disease, Seizures, Tuberculosis. Social History Former smoker, Marital Status - Married, Alcohol Use - Rarely, Drug Use - No History, Caffeine Use - Moderate. Medical History Eyes Denies history of Cataracts, Glaucoma, Optic Neuritis Ear/Nose/Mouth/Throat Denies history of Chronic sinus problems/congestion, Middle ear problems Hematologic/Lymphatic Denies history of Anemia, Hemophilia, Human Immunodeficiency Virus, Lymphedema Respiratory Denies history of Aspiration, Asthma, Chronic Obstructive Pulmonary Disease (COPD), Pneumothorax, Sleep Apnea, Tuberculosis MATAS, BURROWS ([id number]) Cardiovascular Patient has history of Hypertension, Myocardial Infarction - 2014 Denies history of Angina, Arrhythmia, Congestive Heart Failure, Coronary  Artery Disease, Deep Vein Thrombosis, Hypotension, Peripheral Arterial Disease, Peripheral Venous Disease, Phlebitis, Vasculitis Gastrointestinal Patient has history of Colitis Denies history of Cirrhosis , Crohn s, Hepatitis A, Hepatitis B, Hepatitis C Endocrine Patient has history of Type II Diabetes Immunological Denies history of Lupus Erythematosus, Raynaud s, Scleroderma Integumentary (Skin) Denies history of History of Burn, History of pressure wounds Musculoskeletal Denies history of Gout, Rheumatoid Arthritis, Osteoarthritis, Osteomyelitis Neurologic Denies history of Dementia, Neuropathy, Quadriplegia, Paraplegia, Seizure Disorder Oncologic Denies history of Received Chemotherapy, Received Radiation Psychiatric Denies history of Anorexia/bulimia, Confinement Anxiety Patient is treated with Insulin, Oral Agents. Hospitalization/Surgery History - december 2019 [facility] rotator cuff surgery. Medical And Surgical History Notes Genitourinary stage 3 CKD Review of Systems (ROS) Constitutional Symptoms (General Health) Denies complaints or symptoms of Fatigue, Fever, Chills, Marked Weight Change. Eyes Complains or has symptoms of Glasses / Contacts. Denies complaints or symptoms of Dry Eyes, Vision Changes. Ear/Nose/Mouth/Throat Denies complaints or symptoms of Difficult clearing ears, Sinusitis. Hematologic/Lymphatic Denies complaints or symptoms of Bleeding / Clotting Disorders, Human Immunodeficiency Virus. Respiratory Denies complaints or symptoms of Chronic or  frequent coughs, Shortness of Breath. Cardiovascular Denies complaints or symptoms of Chest pain, LE edema. Gastrointestinal Denies complaints or symptoms of Frequent diarrhea, Nausea, Vomiting. Endocrine Complains or has symptoms of Thyroid disease. Denies complaints or symptoms of Hepatitis, Polydypsia (Excessive Thirst). Genitourinary Denies complaints or symptoms of Kidney failure/ Dialysis,  Incontinence/dribbling. Immunological Denies complaints or symptoms of Hives, Itching. Integumentary (Skin) Complains or has symptoms of Wounds. Denies complaints or symptoms of Bleeding or bruising tendency, Breakdown, Swelling. Musculoskeletal Denies complaints or symptoms of Muscle Pain, Muscle Weakness. Neurologic Denies complaints or symptoms of Numbness/parasthesias, Focal/Weakness. Dylan Wright, Dylan Wright ([id number]) Psychiatric Denies complaints or symptoms of Anxiety, Claustrophobia. Objective Constitutional Patient is hypertensive.. Pulse regular and within target range for patient.Marland Kitchen Respirations regular, non-labored and within target range.. Temperature is normal and within the target range for the patient.Marland Kitchen appears in no distress. Vitals Time Taken: 1:24 PM, Height: 75 in, Source: Stated, Weight: 320 lbs, Source: Stated, BMI: 40, Temperature: 98.7 F, Pulse: 48 bpm, Respiratory Rate: 16 breaths/min, Blood Pressure: 148/85 mmHg. Respiratory Respiratory effort is easy and symmetric bilaterally. Rate is normal at rest and on room air.. Bilateral breath sounds are clear and equal in all lobes with no wheezes, rales or rhonchi.. Cardiovascular Heart rhythm and rate regular, without murmur or gallop.. Pedal pulses were normal on the left. Thing abnormal in the left leg. No evidence of venous or arterial disease. Lymphatic None palpable in the popliteal area. Psychiatric No evidence of depression, anxiety, or agitation. Calm, cooperative, and communicative. Appropriate interactions and affect.. General Notes: Wound exam; the area in question is a small open area on the lower left pre-tibia. Necrotic debris around the small opening. The small opening probes to bone. There is a clear drainage from this. Marked tenderness superiorly and laterally to the wound but there is no soft tissue crepitus. Integumentary (Hair, Skin) Some erythema around the wound. Marked tenderness superiorly and  lateral to the area in question.. Wound #1 status is Open. Original cause of wound was Trauma. The wound is located on the Left,Anterior Lower Leg. The wound measures 1.1cm length x 0.8cm width x 1cm depth; 0.691cm^2 area and 0.691cm^3 volume. There is Fat Layer (Subcutaneous Tissue) Exposed exposed. There is tunneling at 12:00 with a maximum distance of 1.4cm. There is a large amount of serous drainage noted. There is small (1-33%) pink granulation within the wound bed. There is a large (67-100%) amount of necrotic tissue within the wound bed including Adherent Slough. 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 There was a Excisional Skin/Subcutaneous Tissue Debridement with a total area of 0.88 sq cm performed by Ricard Dillon, MD. With the following instrument(s): Curette to remove Viable and Non-Viable tissue/material. Material removed includes Subcutaneous Tissue and Slough and after achieving pain control using Lidocaine. No specimens were taken. A time out was conducted at 13:50, prior to the start of the procedure. A Moderate amount of bleeding was controlled with Pressure. The procedure was tolerated well. Post Debridement Measurements: 1.1cm length x 0.8cm width x 1cm depth; 0.691cm^3 volume. Character of Wound/Ulcer Post Debridement requires further debridement. Severity of Tissue  Post Debridement is: Fat layer exposed. Post procedure Diagnosis Wound #1: Same as Pre-Procedure Plan Wound Cleansing: Wound #1 Left,Anterior Lower  Leg: May Shower, gently pat wound dry prior to applying new dressing. Anesthetic (add to Medication List): Wound #1 Left,Anterior Lower Leg: Topical Lidocaine 4% cream applied to wound bed prior to debridement (In Clinic Only). Primary Wound Dressing: Wound #1 Left,Anterior Lower Leg: Silver Alginate - Pack strip lightly into wound Secondary Dressing: Wound #1 Left,Anterior Lower Leg: Boardered Foam Dressing Dressing Change Frequency: Wound #1 Left,Anterior Lower Leg: Change dressing every day. Follow-up Appointments: Wound #1 Left,Anterior Lower Leg: Return Appointment in 1 week. Edema Control: Wound #1 Left,Anterior Lower Leg: Elevate legs to the level of the heart and pump ankles as often as possible Services and Therapies ordered were: DME provider, dressing supplies Radiology ordered were: X-ray, lower leg - Left Tib/Fib Laboratory ordered were: Wound culture routine - left lower leg DAISHAWN, LAUF. ([id number]) 1. Traumatic wound presumably with secondary cellulitis. He has just finished 15 days of doxycycline without improvement. I have cultured this area again. I will solicit records from the primary office that did the original culture 2. This area probes to bone therefore he will need a plain x-ray perhaps an MRI depending on what the x-ray shows and how this wound progresses 3. I am assuming that this became secondarily infected. Bacteria would seem likely given the history and the tissue destruction 4. X-ray ordered 5. We will use silver alginate packing strips and layering over the wound. 6. Because of the adherent debris over the wound I attempted debridement there is necrotic debris underneath in the tunnel as well Electronic Signature(s) Signed: 03/17/2019 5:14:46 PM By: Baltazar Najjar MD Entered By: Baltazar Najjar on 03/17/2019 14:14:06 Dylan Wright ([id number]) -------------------------------------------------------------------------------- ROS/PFSH  Details Patient Name: Dylan Wright, Dylan Wright. Date of Service: 03/17/2019 1:15 PM Medical Record Number: [id number] Patient Account Number: [id number] Date of Birth/Sex: August 19, 1960 (58 y.o. M) Treating RN: Rodell Perna Primary Care Provider: Lucy Chris Other Clinician: Referring Provider: Eddie Candle Treating Provider/Extender: Altamese [place] in Treatment: 0 Information Obtained From Patient Constitutional Symptoms (General Health) Complaints and Symptoms: Negative for: Fatigue; Fever; Chills; Marked Weight Change Eyes Complaints and Symptoms: Positive for: Glasses / Contacts Negative for: Dry Eyes; Vision Changes Medical History: Negative for: Cataracts; Glaucoma; Optic Neuritis Ear/Nose/Mouth/Throat Complaints and Symptoms: Negative for: Difficult clearing ears; Sinusitis Medical History: Negative for: Chronic sinus problems/congestion; Middle ear problems Hematologic/Lymphatic Complaints and Symptoms: Negative for: Bleeding / Clotting Disorders; Human Immunodeficiency Virus Medical History: Negative for: Anemia; Hemophilia; Human Immunodeficiency Virus; Lymphedema Respiratory Complaints and Symptoms: Negative for: Chronic or frequent coughs; Shortness of Breath Medical History: Negative for: Aspiration; Asthma; Chronic Obstructive Pulmonary Disease (COPD); Pneumothorax; Sleep Apnea; Tuberculosis Cardiovascular Complaints and Symptoms: Negative for: Chest pain; LE edema Medical History: Positive for: Hypertension; Myocardial Infarction - 2014 Negative for: Angina; Arrhythmia; Congestive Heart Failure; Coronary Artery Disease; Deep Vein Thrombosis; Hypotension; Dylan Wright, Dylan Wright ([id number]) Peripheral Arterial Disease; Peripheral Venous Disease; Phlebitis; Vasculitis Gastrointestinal Complaints and Symptoms: Negative for: Frequent diarrhea; Nausea; Vomiting Medical History: Positive for: Colitis Negative for: Cirrhosis ; Crohnos; Hepatitis A; Hepatitis B;  Hepatitis C Endocrine Complaints and Symptoms: Positive for: Thyroid disease Negative for: Hepatitis; Polydypsia (Excessive Thirst) Medical History: Positive for: Type II Diabetes Time with diabetes: 30 years Treated with: Insulin, Oral agents Genitourinary Complaints and Symptoms: Negative for: Kidney failure/ Dialysis; Incontinence/dribbling Medical History: Past Medical History Notes: stage 3 CKD Immunological Complaints and Symptoms: Negative for: Hives;  Itching Medical History: Negative for: Lupus Erythematosus; Raynaudos; Scleroderma Integumentary (Skin) Complaints and Symptoms: Positive for: Wounds Negative for: Bleeding or bruising tendency; Breakdown; Swelling Medical History: Negative for: History of Burn; History of pressure wounds Musculoskeletal Complaints and Symptoms: Negative for: Muscle Pain; Muscle Weakness Medical History: Negative for: Gout; Rheumatoid Arthritis; Osteoarthritis; Osteomyelitis Neurologic Dylan GalasGRANT, Dylan Wright. ([id number]) Complaints and Symptoms: Negative for: Numbness/parasthesias; Focal/Weakness Medical History: Negative for: Dementia; Neuropathy; Quadriplegia; Paraplegia; Seizure Disorder Psychiatric Complaints and Symptoms: Negative for: Anxiety; Claustrophobia Medical History: Negative for: Anorexia/bulimia; Confinement Anxiety Oncologic Medical History: Negative for: Received Chemotherapy; Received Radiation Immunizations Pneumococcal Vaccine: Received Pneumococcal Vaccination: Yes Implantable Devices None Hospitalization / Surgery History Type of Hospitalization/Surgery december 2019 [facility] rotator cuff surgery Family and Social History Cancer: Yes - Mother,Maternal Grandparents; Diabetes: Yes - Mother,Father,Maternal Grandparents,Paternal Grandparents,Siblings; Heart Disease: Yes - Father; Hereditary Spherocytosis: No; Hypertension: Yes - Mother,Father; Kidney Disease: No; Lung Disease: No; Seizures: No; Stroke: Yes -  Mother; Thyroid Problems: Yes - Father,Mother; Tuberculosis: No; Former smoker; Marital Status - Married; Alcohol Use: Rarely; Drug Use: No History; Caffeine Use: Moderate; Financial Concerns: No; Food, Clothing or Shelter Needs: No; Support System Lacking: No; Transportation Concerns: No Electronic Signature(s) Signed: 03/17/2019 4:09:57 PM By: Rodell PernaScott, Dajea Signed: 03/17/2019 5:14:46 PM By: Baltazar Najjarobson, Jayliana Valencia MD Entered By: Rodell PernaScott, Dajea on 03/17/2019 13:31:35 Dylan GalasGRANT, Dylan Wright. ([id number]) -------------------------------------------------------------------------------- SuperBill Details Patient Name: Dylan GalasGRANT, Brixon Wright. Date of Service: 03/17/2019 Medical Record Number: [id number] Patient Account Number: [id number] Date of Birth/Sex: [birth year] (58 y.o. 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 S81.812D Laceration without foreign body, left lower leg, subsequent encounter L03.116 Cellulitis of left lower limb E11.622 Type 2 diabetes mellitus with other skin ulcer Facility Procedures CPT4 Code Description: [id number] 99214 - WOUND CARE VISIT-LEV 4 EST PT Modifier: Quantity: 1 CPT4 Code Description: [id number] 11042 - DEB SUBQ TISSUE 20 SQ CM/< ICD-10 Diagnosis Description L97.824 Non-pressure chronic ulcer of other part of left lower leg with Modifier: necrosis of bo Quantity: 1 ne Physician Procedures CPT4 Code Description: [id number] WC PHYS LEVEL 3 o NEW PT ICD-10 Diagnosis Description L97.824 Non-pressure chronic ulcer of other part of left lower leg wit S81.812D Laceration without foreign body, left lower leg, subsequent en L03.116 Cellulitis of left  lower limb E11.622 Type 2 diabetes mellitus with other skin  ulcer Modifier: 25 h necrosis of bon counter Quantity: 1 e CPT4 Code Description: [id number] 11042 - WC PHYS SUBQ TISS 20 SQ CM ICD-10 Diagnosis Description L97.824 Non-pressure chronic ulcer of other part of left lower leg wit Modifier: h necrosis of bon Quantity: 1 e Electronic Signature(s) Signed: 03/17/2019 5:14:46 PM By: Baltazar Najjarobson, Ifeanyichukwu Wickham MD Entered By: Baltazar Najjarobson, Lenis Nettleton on 03/17/2019 14:14:49

## 2019-03-17 NOTE — Progress Notes (Signed)
KEY, CEN ([id number]) Visit Report for 03/17/2019 Allergy List Details Patient Name: Dylan Wright, Dylan Wright. Date of Service: 03/17/2019 1:15 PM Medical Record Number: [id number] Patient Account Number: [id number] Date of Birth/Sex: Nov 03, 1960 (58 y.o. M) Treating RN: Rodell Perna Primary Care Phillp Dolores: Lucy Chris Other Clinician: Referring Nalaya Wojdyla: Eddie Candle Treating Deryl Ports/Extender: Maxwell Caul Weeks in Treatment: 0 Allergies Active Allergies Cipro Reaction: palpitations Victoza Reaction: nausea Darvocet-N Allergy Notes Electronic Signature(s) Signed: 03/17/2019 4:09:57 PM By: Rodell Perna Entered By: Rodell Perna on 03/17/2019 13:26:07 Dylan Galas ([id number]) -------------------------------------------------------------------------------- Arrival Information Details Patient Name: Dylan Wright, Dylan Wright. Date of Service: 03/17/2019 1:15 PM Medical Record Number: [id number] Patient Account Number: [id number] Date of Birth/Sex: 12/31/1960 (58 y.o. M) Treating RN: Rodell Perna Primary Care Nasha Diss: Lucy Chris Other Clinician: Referring Talena Neira: Eddie Candle Treating Zykira Matlack/Extender: Altamese [place] in Treatment: 0 Visit Information Patient Arrived: Ambulatory Arrival Time: 13:23 Accompanied By: self Transfer Assistance: None Patient Identification Verified: Yes Electronic Signature(s) Signed: 03/17/2019 4:09:57 PM By: Rodell Perna Entered By: Rodell Perna on 03/17/2019 13:23:32 Dylan Galas ([id number]) -------------------------------------------------------------------------------- Clinic Level of Care Assessment Details Patient Name: Dylan Wright, Dylan Wright. Date of Service: 03/17/2019 1:15 PM Medical Record Number: [id number] Patient Account Number: [id number] Date of Birth/Sex: [birth year] (58 y.o. M) Treating RN: Huel Coventry Primary Care Vedansh Kerstetter: Lucy Chris Other Clinician: Referring Andilynn Delavega: Eddie Candle Treating Selestino Nila/Extender: Altamese [place] in Treatment: 0 Clinic Level of Care Assessment Items TOOL 2 Quantity Score []  - Use when only an EandM is performed on the INITIAL visit 0 ASSESSMENTS - Nursing Assessment / Reassessment X - General Physical Exam (combine w/ comprehensive assessment (listed just below) when 1 20 performed on new pt. evals) X- 1 25 Comprehensive Assessment (HX, ROS, Risk Assessments, Wounds Hx, etc.) ASSESSMENTS - Wound and Skin Assessment / Reassessment X - Simple Wound Assessment / Reassessment - one wound 1 5 []  - 0 Complex Wound Assessment / Reassessment - multiple wounds []  - 0 Dermatologic / Skin Assessment (not related to wound area) ASSESSMENTS - Ostomy and/or Continence Assessment and Care []  - Incontinence Assessment and Management 0 []  - 0 Ostomy Care Assessment and Management (repouching, etc.) PROCESS - Coordination of Care X - Simple Patient / Family Education for ongoing care 1 15 []  - 0 Complex (extensive) Patient / Family Education for ongoing care X- 1 10 Staff obtains , Records, Test Results / Process Orders []  - 0 Staff telephones HHA, Nursing Homes / Clarify orders / etc []  - 0 Routine Transfer to another Facility (non-emergent condition) []  - 0 Routine Hospital Admission (non-emergent condition) X- 1 15 New Admissions / / Ordering NPWT, Apligraf, etc. []  - 0 Emergency Hospital Admission (emergent condition) X- 1 10 Simple Discharge Coordination []  - 0 Complex (extensive) Discharge Coordination PROCESS - Special Needs []  - Pediatric / Minor Patient Management 0 []  - 0 Isolation Patient Management KAMDEN, STANISLAW ( ) []  - 0 Hearing / Language / Visual special needs []  - 0 Assessment of Community assistance (transportation, D/C planning, etc.) []  - 0 Additional assistance / Altered mentation []  - 0 Support Surface(s) Assessment (bed, cushion, seat, etc.) INTERVENTIONS - Wound Cleansing / Measurement X -  Wound Imaging (photographs - any number of wounds) 1 5 []  - 0 Wound Tracing (instead of photographs) X- 1 5 Simple Wound Measurement - one wound []  - 0 Complex Wound Measurement - multiple wounds X- 1 5 Simple Wound Cleansing - one wound []  - 0 Complex Wound Cleansing -  multiple wounds INTERVENTIONS - Wound Dressings []  - Small Wound Dressing one or multiple wounds 0 X- 1 15 Medium Wound Dressing one or multiple wounds []  - 0 Large Wound Dressing one or multiple wounds []  - 0 Application of Medications - injection INTERVENTIONS - Miscellaneous []  - External ear exam 0 []  - 0 Specimen Collection (cultures, biopsies, blood, body fluids, etc.) []  - 0 Specimen(s) / Culture(s) sent or taken to Lab for analysis []  - 0 Patient Transfer (multiple staff / Nurse, adultHoyer Lift / Similar devices) []  - 0 Simple Staple / Suture removal (25 or less) []  - 0 Complex Staple / Suture removal (26 or more) []  - 0 Hypo / Hyperglycemic Management (close monitor of Blood Glucose) X- 1 15 Ankle / Brachial Index (ABI) - do not check if billed separately Has the patient been seen at the hospital within the last three years: Yes Total Score: 145 Level Of Care: New/Established - Level 4 Electronic Signature(s) Signed: 03/17/2019 5:19:46 PM By: Elliot GurneyWoody, BSN, RN, CWS, Kim RN, BSN Entered By: Elliot GurneyWoody, BSN, RN, CWS, Kim on 03/17/2019 14:01:38 Dylan Wright, Dylan D. ([id number]) -------------------------------------------------------------------------------- Encounter Discharge Information Details Patient Name: Dylan Wright, Dylan D. Date of Service: 03/17/2019 1:15 PM Medical Record Number: [id number] Patient Account Number: [id number] Date of Birth/Sex: 1961-01-05 (58 y.o. M) Treating RN: Huel CoventryWoody, Kim Primary Care Taiwana Willison: Lucy ChrisGOERES, LINDSEY Other Clinician: Referring Tkai Large: Eddie CandleNELSON, SARAH Treating Rhylan Kagel/Extender: Altamese CarolinaOBSON, MICHAEL G Weeks in Treatment: 0 Encounter Discharge Information Items Post Procedure Vitals Discharge  Condition: Stable Temperature (F): 98.7 Ambulatory Status: Ambulatory Pulse (bpm): 48 Discharge Destination: Home Respiratory Rate (breaths/min): 16 Transportation: Private Auto Blood Pressure (mmHg): 148/85 Accompanied By: self Schedule Follow-up Appointment: Yes Clinical Summary of Care: Electronic Signature(s) Signed: 03/17/2019 5:19:46 PM By: Elliot GurneyWoody, BSN, RN, CWS, Kim RN, BSN Entered By: Elliot GurneyWoody, BSN, RN, CWS, Kim on 03/17/2019 14:07:56 Dylan Wright, Dylan D. ([id number]) -------------------------------------------------------------------------------- Lower Extremity Assessment Details Patient Name: Dylan Wright, Dylan D. Date of Service: 03/17/2019 1:15 PM Medical Record Number: [id number] Patient Account Number: [id number] Date of Birth/Sex: 1961-01-05 (58 y.o. M) Treating RN: Rodell PernaScott, Dajea Primary Care Desire Fulp: Lucy ChrisGOERES, LINDSEY Other Clinician: Referring Fenix Rorke: Eddie CandleNELSON, SARAH Treating Jeffifer Rabold/Extender: Altamese CarolinaOBSON, MICHAEL G Weeks in Treatment: 0 Edema Assessment Assessed: [Left: No] [Right: No] Edema: [Left: N] [Right: o] Calf Left: Right: Point of Measurement: 39 cm From Medial Instep 42 cm cm Ankle Left: Right: Point of Measurement: 11 cm From Medial Instep 25 cm cm Vascular Assessment Pulses: Dorsalis Pedis Palpable: [Left:Yes] Doppler Audible: [Left:Yes] Posterior Tibial Palpable: [Left:Yes] Doppler Audible: [Left:Yes] Blood Pressure: Brachial: [Left:140] Dorsalis Pedis: 190 Ankle: Posterior Tibial: 170 Ankle Brachial Index: [Left:1.36] Electronic Signature(s) Signed: 03/17/2019 4:09:57 PM By: Rodell PernaScott, Dajea Entered By: Rodell PernaScott, Dajea on 03/17/2019 13:41:36 Dylan Wright, Fue D. ([id number]) -------------------------------------------------------------------------------- Multi Wound Chart Details Patient Name: Dylan Wright, Dylan D. Date of Service: 03/17/2019 1:15 PM Medical Record Number: [id number] Patient Account Number: [id number] Date of Birth/Sex: 1961-01-05 (58 y.o. M) Treating RN:  Huel CoventryWoody, Kim Primary Care Abygayle Deltoro: Lucy ChrisGOERES, LINDSEY Other Clinician: Referring Branch Pacitti: Eddie CandleNELSON, SARAH Treating Alora Gorey/Extender: Altamese CarolinaOBSON, MICHAEL G Weeks in Treatment: 0 Vital Signs Height(in): 75 Pulse(bpm): 48 Weight(lbs): 320 Blood Pressure(mmHg): 148/85 Body Mass Index(BMI): 40 Temperature(F): 98.7 Respiratory Rate 16 (breaths/min): Photos: [N/A:N/A] Wound Location: Left Lower Leg - Anterior N/A N/A Wounding Event: Trauma N/A N/A Primary Etiology: Diabetic Wound/Ulcer of the N/A N/A Lower Extremity Comorbid History: Hypertension, Myocardial N/A N/A Infarction, Colitis, Type II Diabetes Date Acquired: 02/09/2019 N/A N/A Weeks of Treatment: 0 N/A N/A Wound Status: Open N/A N/A Measurements L x W  x D 1.1x0.8x1 N/A N/A (cm) Area (cm) : 0.691 N/A N/A Volume (cm) : 0.691 N/A N/A Position 1 (o'clock): 12 Maximum Distance 1 (cm): 1.4 Tunneling: Yes N/A N/A Classification: Grade 1 N/A N/A Exudate Amount: Large N/A N/A Exudate Type: Serous N/A N/A Exudate Color: amber N/A N/A Granulation Amount: Small (1-33%) N/A N/A Granulation Quality: Pink N/A N/A Necrotic Amount: Large (67-100%) N/A N/A Exposed Structures: Fat Layer (Subcutaneous N/A N/A Tissue) Exposed: Yes Fascia: No Tendon: No Muscle: No EVARISTO, TSUDA ([id number]) Joint: No Bone: No Epithelialization: None N/A N/A Debridement: Debridement - Excisional N/A N/A Pre-procedure 13:50 N/A N/A Verification/Time Out Taken: Pain Control: Lidocaine N/A N/A Tissue Debrided: Subcutaneous, Slough N/A N/A Level: Skin/Subcutaneous Tissue N/A N/A Debridement Area (sq cm): 0.88 N/A N/A Instrument: Curette N/A N/A Bleeding: Moderate N/A N/A Hemostasis Achieved: Pressure N/A N/A Debridement Treatment Procedure was tolerated well N/A N/A Response: Post Debridement 1.1x0.8x1 N/A N/A Measurements L x W x D (cm) Post Debridement Volume: 0.691 N/A N/A (cm) Procedures Performed: Debridement N/A N/A Treatment Notes Wound  #1 (Left, Anterior Lower Leg) Notes Silver Cell, packed lightly into wound and BFD Electronic Signature(s) Signed: 03/17/2019 5:14:46 PM By: Baltazar Najjar MD Entered By: Baltazar Najjar on 03/17/2019 14:07:30 Dylan Galas ([id number]) -------------------------------------------------------------------------------- Multi-Disciplinary Care Plan Details Patient Name: Dylan Wright, DUNN. Date of Service: 03/17/2019 1:15 PM Medical Record Number: [id number] Patient Account Number: [id number] Date of Birth/Sex: [birth year] (58 y.o. M) Treating RN: Huel Coventry Primary Care Marianny Goris: Lucy Chris Other Clinician: Referring Leomar Westberg: Eddie Candle Treating Norvel Wenker/Extender: Altamese [place] in Treatment: 0 Active Inactive Necrotic Tissue Nursing Diagnoses: Impaired tissue integrity related to necrotic/devitalized tissue Goals: Necrotic/devitalized tissue will be minimized in the wound bed Date Initiated: 03/17/2019 Target Resolution Date: 03/24/2019 Goal Status: Active Interventions: Assess patient pain level pre-, during and post procedure and prior to discharge Treatment Activities: Apply topical anesthetic as ordered : 03/17/2019 Notes: Orientation to the Wound Care Program Nursing Diagnoses: Knowledge deficit related to the wound healing center program Goals: Patient/caregiver will verbalize understanding of the Wound Healing Center Program Date Initiated: 03/17/2019 Target Resolution Date: 03/24/2019 Goal Status: Active Interventions: Provide education on orientation to the wound center Notes: Wound/Skin Impairment Nursing Diagnoses: Impaired tissue integrity Goals: Ulcer/skin breakdown will have a volume reduction of 30% by week 4 Date Initiated: 03/17/2019 Target Resolution Date: 04/17/2019 Goal Status: Active ARSHAD, OBERHOLZER ([id number]) Interventions: Provide education on ulcer and skin care Treatment Activities: Skin care regimen initiated : 03/17/2019 Topical  wound management initiated : 03/17/2019 Notes: Electronic Signature(s) Signed: 03/17/2019 5:19:46 PM By: Elliot Gurney, BSN, RN, CWS, Kim RN, BSN Entered By: Elliot Gurney, BSN, RN, CWS, Kim on 03/17/2019 13:52:34 Dylan Galas ([id number]) -------------------------------------------------------------------------------- Pain Assessment Details Patient Name: Dylan Wright, FELDHAUS. Date of Service: 03/17/2019 1:15 PM Medical Record Number: [id number] Patient Account Number: [id number] Date of Birth/Sex: March 30, 1961 (58 y.o. M) Treating RN: Rodell Perna Primary Care Joniqua Sidle: Lucy Chris Other Clinician: Referring Jawanna Dykman: Eddie Candle Treating Lindell Tussey/Extender: Altamese [place] in Treatment: 0 Active Problems Location of Pain Severity and Description of Pain Patient Has Paino Yes Site Locations Pain Location: Pain in Ulcers Rate the pain. Current Pain Level: 5 Pain Management and Medication Current Pain Management: Electronic Signature(s) Signed: 03/17/2019 4:09:57 PM By: Rodell Perna Entered By: Rodell Perna on 03/17/2019 13:23:50 Dylan Galas ([id number]) -------------------------------------------------------------------------------- Wound Assessment Details Patient Name: YUTA, CIPOLLONE. Date of Service: 03/17/2019 1:15 PM Medical Record Number: [id number] Patient Account Number: [id number] Date of Birth/Sex: 1961/04/28 (57  y.o. M) Treating RN: Army Melia Primary Care Earlyne Feeser: Mcneil Sober Other Clinician: Referring Kharter Brew: Colbert Ewing Treating Madoc Holquin/Extender: Tito Dine in Treatment: 0 Wound Status Wound Number: 1 Primary Diabetic Wound/Ulcer of the Lower Extremity Etiology: Wound Location: Left Lower Leg - Anterior Wound Status: Open Wounding Event: Trauma Comorbid Hypertension, Myocardial Infarction, Colitis, Date Acquired: 02/09/2019 History: Type II Diabetes Weeks Of Treatment: 0 Clustered Wound: No Photos Wound Measurements Length: (cm) 1.1 %  Reduction Width: (cm) 0.8 % Reduction Depth: (cm) 1 Epitheliali Area: (cm) 0.691 Tunneling: Volume: (cm) 0.691 Positio Maximum in Area: in Volume: zation: None Yes n (o'clock): 12 Distance: (cm) 1.4 Wound Description Classification: Grade 1 Foul Odor A Exudate Amount: Large Slough/Fibr Exudate Type: Serous Exudate Color: amber fter Cleansing: No ino Yes Wound Bed Granulation Amount: Small (1-33%) Exposed Structure Granulation Quality: Pink Fascia Exposed: No Necrotic Amount: Large (67-100%) Fat Layer (Subcutaneous Tissue) Exposed: Yes Necrotic Quality: Adherent Slough Tendon Exposed: No Muscle Exposed: No Joint Exposed: No Bone Exposed: No Treatment Notes ASEEL, UHDE ([id number]) Wound #1 (Left, Anterior Lower Leg) Notes Silver Cell, packed lightly into wound and BFD Electronic Signature(s) Signed: 03/17/2019 4:09:57 PM By: Army Melia Entered By: Army Melia on 03/17/2019 13:36:48 [name] ([id number]) -------------------------------------------------------------------------------- Vitals Details Patient Name: [name]. Date of Service: 03/17/2019 1:15 PM Medical Record Number: [id number] Patient Account Number: [id number] Date of Birth/Sex: May 10, 1961 (58 y.o. M) Treating RN: Army Melia Primary Care Jamileth Putzier: Mcneil Sober Other Clinician: Referring Madalynn Pickelsimer: Colbert Ewing Treating Peytan Andringa/Extender: Tito Dine in Treatment: 0 Vital Signs Time Taken: 13:24 Temperature (F): 98.7 Height (in): 75 Pulse (bpm): 48 Source: Stated Respiratory Rate (breaths/min): 16 Weight (lbs): 320 Blood Pressure (mmHg): 148/85 Source: Stated Reference Range: 80 - 120 mg / dl Body Mass Index (BMI): 40 Electronic Signature(s) Signed: 03/17/2019 4:09:57 PM By: Army Melia Entered By: Army Melia on 03/17/2019 13:24:24

## 2019-03-20 LAB — AEROBIC CULTURE? (SUPERFICIAL SPECIMEN): Culture: NORMAL

## 2019-03-20 LAB — AEROBIC CULTURE W GRAM STAIN (SUPERFICIAL SPECIMEN)

## 2019-03-23 ENCOUNTER — Ambulatory Visit: Payer: Medicare Other | Admitting: Physician Assistant

## 2019-03-24 ENCOUNTER — Encounter: Payer: Medicare Other | Admitting: Internal Medicine

## 2019-03-24 ENCOUNTER — Other Ambulatory Visit: Payer: Self-pay

## 2019-03-24 DIAGNOSIS — E11622 Type 2 diabetes mellitus with other skin ulcer: Secondary | ICD-10-CM | POA: Diagnosis not present

## 2019-03-25 NOTE — Progress Notes (Signed)
AVEN, CHRISTEN ([id number]) Visit Report for 03/24/2019 HPI Details Patient Name: Dylan Wright, Dylan Wright. Date of Service: 03/24/2019 8:45 AM Medical Record Number: [id number] Patient Account Number: [id number] Date of Birth/Sex: 1960/10/11 (58 y.o. M) Treating RN: Huel Coventry Primary Care Provider: Lucy Chris Other Clinician: Referring Provider: Lucy Chris Treating Provider/Extender: Altamese [place] in Treatment: 1 History of Present Illness HPI Description: ADMISSION 03/17/2019 This is a 58 year old diabetic man who was cutting his lawn of roughly a month ago. A small rock spun out and hit him in the left lower leg. There was immediate superficial injury. He said the pain by that night was quite significant. Saw his primary doctor's office at [facility] clinic in [place] apparently a culture was done and the patient was prescribed 15 days of doxycycline which he just finished 3 days ago. This is not made any difference. There is a fair amount of drainage still tenderness and aching pain. He has been only covering this with a Band-Aid. 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 X-ray did not show any bone damage. I am going to try to get a result of the culture that his primary doctor did just to ensure that it was negative. He was empirically given doxycycline. He is still complaining of a lot of pain. Using silver alginate Electronic Signature(s) Signed: 03/24/2019 5:10:21 PM By: Baltazar Najjar MD Entered By: Baltazar Najjar on 03/24/2019 09:48:58 Dylan Galas ([id number]) -------------------------------------------------------------------------------- Physical  Exam Details Patient Name: Dylan Wright, Dylan Wright. Date of Service: 03/24/2019 8:45 AM Medical Record Number: [id number] Patient Account Number: [id number] Date of Birth/Sex: 1961-01-03 (58 y.o. M) Treating RN: Huel Coventry Primary Care Provider: Lucy Chris Other Clinician: Referring Provider: Lucy Chris Treating Provider/Extender: Altamese [place] in Treatment: 1 Constitutional Patient is hypertensive.. Pulse regular and within target range for patient.Marland Kitchen Respirations regular, non-labored and within target range.. Temperature is normal and within the target range for the patient.Marland Kitchen Respiratory Respiratory effort is easy and symmetric bilaterally. Rate is normal at rest and on room air.. Cardiovascular Pedal pulses palpable on the left. Integumentary (Hair, Skin) There is erythema and some swelling around the wound. This area is very tender. Notes Wound exam; the area in question is a small open area on the lower tibial area. in terms of the depth this is improved. This no longer goes to bone and measurements are improved. Still remarkably tender around this even beyond the area of erythema. Electronic Signature(s) Signed: 03/24/2019 5:10:21 PM By: Baltazar Najjar MD Entered By: Baltazar Najjar on 03/24/2019 09:51:47 Dylan Galas ([id number]) -------------------------------------------------------------------------------- Physician Orders Details Patient Name: Dylan Wright, Dylan Wright. Date of Service: 03/24/2019 8:45 AM Medical Record Number: [id number] Patient Account Number: [id number] Date of Birth/Sex: [birth year] (58 y.o. M) Treating RN: Huel Coventry Primary Care Provider: Lucy Chris Other Clinician: Referring Provider: Lucy Chris Treating Provider/Extender: Altamese [place] in Treatment: 1 Verbal / Phone Orders: No Diagnosis Coding Wound Cleansing Wound #1 Left,Anterior Lower Leg o May Shower, gently pat wound dry prior to applying new dressing. Anesthetic  (add to Medication List) Wound #1 Left,Anterior Lower Leg o Topical Lidocaine 4% cream applied to wound bed prior to debridement (In Clinic Only). Primary Wound Dressing Wound #1 Left,Anterior Lower  Leg o Silver Alginate - Pack strip lightly into wound Secondary Dressing Wound #1 Left,Anterior Lower Leg o Boardered Foam Dressing Dressing Change Frequency Wound #1 Left,Anterior Lower Leg o Change dressing every day. Follow-up Appointments Wound #1 Left,Anterior Lower Leg o Return Appointment in 1 week. Edema Control Wound #1 Left,Anterior Lower Leg o Elevate legs to the level of the heart and pump ankles as often as possible Medications-please add to medication list. Wound #1 Left,Anterior Lower Leg o P.O. Antibiotics Electronic Signature(s) Signed: 03/24/2019 1:48:26 PM By: Elliot GurneyWoody, BSN, RN, CWS, Kim RN, BSN Signed: 03/24/2019 5:10:21 PM By: Baltazar Najjarobson, Michael MD Entered By: Elliot GurneyWoody, BSN, RN, CWS, Kim on 03/24/2019 09:29:50 Dylan Wright, Dylan D. ([id number]) -------------------------------------------------------------------------------- Problem List Details Patient Name: Dylan Wright, Dylan D. Date of Service: 03/24/2019 8:45 AM Medical Record Number: [id number] Patient Account Number: [id number] Date of Birth/Sex: 04/10/1961 (58 y.o. 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 ([id number]) -------------------------------------------------------------------------------- Progress Note Details Patient Name: Dylan Wright, Dylan D. Date of Service: 03/24/2019 8:45 AM Medical Record Number: [id number] Patient Account Number: [id number] Date of Birth/Sex: 04/10/1961 (58 y.o. M) Treating RN: Huel CoventryWoody, Kim Primary Care Provider: Lucy ChrisGOERES, LINDSEY Other Clinician: Referring Provider: Lucy ChrisGOERES, LINDSEY Treating Provider/Extender: Altamese CarolinaOBSON, MICHAEL G Weeks in Treatment: 1 Subjective History of Present Illness (HPI) ADMISSION 03/17/2019 This is a 58 year old diabetic man who was cutting his lawn of roughly a month ago. A small rock spun out and hit him in the left lower leg. There was immediate superficial injury. He said the pain by that night was quite significant. [facility] his primary [facility]'s [facility] in [place] apparently a culture was done and the patient was prescribed 15 days of doxycycline which he just finished 3 days ago. This is not made any difference. There is a fair amount of drainage still tenderness and aching pain. He has been only covering this with a Band-Aid. 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 X-ray did not show any bone damage. I am going to try to get a result of the culture that his primary doctor did just to ensure that it was negative. He was empirically given doxycycline. He is still complaining of a lot of pain. Using silver alginate Objective Constitutional Patient is hypertensive.. Pulse  regular and within target range for patient.Marland Kitchen. Respirations regular, non-labored and within target range.. Temperature is normal and within the target range for the patient.. Vitals Time Taken: 8:55 AM, Height: 75 in, Weight: 320 lbs, BMI: 40, Temperature: 99.1 F, Pulse: 46 bpm, Respiratory Rate: 18 breaths/min, Blood Pressure: 159/85 mmHg. Respiratory Respiratory effort is easy and symmetric bilaterally. Rate is normal at  rest and on room air.. Cardiovascular Pedal pulses palpable on the left. General Notes: Wound exam; the area in question is a small open area on the lower tibial area. in terms of the depth this is improved. This no longer goes to bone and measurements are improved. Still remarkably tender around this even beyond the [place] ([id number]) area of erythema. Integumentary (Hair, Skin) There is erythema and some swelling around the wound. This area is very tender. Wound #1 status is Open. Original cause of wound was Trauma. The wound is located on the Left,Anterior Lower Leg. The wound measures 0.8cm length x 0.5cm width x 0.9cm depth; 0.314cm^2 area and 0.283cm^3 volume. There is Fat Layer (Subcutaneous Tissue) Exposed exposed. There is no undermining noted, however, there is tunneling at 11:00 with a maximum distance of 0.9cm. There is a large amount of serous drainage noted. The wound margin is flat and intact. There is medium (34-66%) pink granulation within the wound bed. There is a medium (34-66%) amount of necrotic tissue within the wound bed including Adherent Slough. 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 Anesthetic  (add to Medication List): Wound #1 Left,Anterior Lower Leg: Topical Lidocaine 4% cream applied to wound bed prior to debridement (In Clinic Only). Primary Wound Dressing: Wound #1 Left,Anterior Lower Leg: Silver Alginate - Pack strip lightly into wound Secondary Dressing: Wound #1 Left,Anterior Lower Leg: Boardered Foam Dressing Dressing Change Frequency: Wound #1 Left,Anterior Lower Leg: Change dressing every day. Follow-up Appointments: Wound #1 Left,Anterior Lower Leg: Return Appointment in 1 week. Edema Control: Wound #1 Left,Anterior Lower Leg: Elevate legs to the level of the heart and pump ankles as often as possible Medications-please add to medication list.: Wound #1 Left,Anterior Lower Leg: P.O. Antibiotics Dylan Wright, Dylan Wright ([id number]) 1. Continue silver alginate. The good news in today's visit is that the wound measures smaller there is no palpable bone and the depth is improved 2. Culture we did last week was negative. I will try to find the result of the culture that the primary doctor did. If this was negative as well I will retry him on a beta-lactam antibiotic. Doxycycline does not cover strep well in some circumstances. 3. Finally I would wonder about an MRI if all of this is negative and he is not improving with the severe pain Electronic Signature(s) Signed: 03/24/2019 5:10:21 PM By: Linton Ham MD Entered By: Linton Ham on 03/24/2019 09:53:33 Dylan Wright ([id number]) -------------------------------------------------------------------------------- SuperBill Details Patient Name: Dylan Wright. Date of Service: 03/24/2019 Medical Record Number: [id number] Patient Account Number: [id number] Date of Birth/Sex: 1961/01/17 (58 y.o. 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 S81.812D Laceration without foreign body, left lower leg, subsequent encounter L03.116 Cellulitis of left lower limb E11.622 Type 2 diabetes mellitus with other skin ulcer Facility Procedures CPT4 Code: [id number] Description: 99213 - WOUND CARE VISIT-LEV 3 EST PT Modifier: Quantity: 1 Physician Procedures CPT4 Code Description: [id number] - WC PHYS LEVEL 3 - EST  PT ICD-10 Diagnosis Description L97.824 Non-pressure chronic ulcer of other part of left lower leg wi S81.812D Laceration without foreign body, left lower leg, subsequent e L03.116 Cellulitis  of left lower limb E11.622 Type 2 diabetes mellitus with other skin ulcer Modifier: th necrosis of bon ncounter Quantity: 1 e Electronic Signature(s) Signed: 03/24/2019 5:10:21 PM By: Baltazar Najjar MD Entered By: Baltazar Najjar on 03/24/2019 09:54:00

## 2019-03-25 NOTE — Progress Notes (Signed)
ZAHID, CARNEIRO ([id number]) Visit Report for 03/24/2019 Arrival Information Details Patient Name: Dylan Wright, Dylan Wright. Date of Service: 03/24/2019 8:45 AM Medical Record Number: [id number] Patient Account Number: [id number] Date of Birth/Sex: Jul 08, 1960 (58 y.o. M) Treating RN: Harold Barban Primary Care Macil Crady: Mcneil Sober Other Clinician: Referring Inola Lisle: Mcneil Sober Treating Lorren Rossetti/Extender: Tito Dine in Treatment: 1 Visit Information History Since Last Visit Added or deleted any medications: No Patient Arrived: Cane Any new allergies or adverse reactions: No Arrival Time: 08:55 Had a fall or experienced change in No Accompanied By: wife activities of daily living that may affect Transfer Assistance: None risk of falls: Patient Identification Verified: Yes Signs or symptoms of abuse/neglect since last visito No Secondary Verification Process Completed: Yes Hospitalized since last visit: No Has Dressing in Place as Prescribed: Yes Pain Present Now: Yes Electronic Signature(s) Signed: 03/24/2019 4:37:37 PM By: Harold Barban Entered By: Harold Barban on 03/24/2019 08:57:07 Dylan Wright ([id number]) -------------------------------------------------------------------------------- Clinic Level of Care Assessment Details Patient Name: Dylan Wright. Date of Service: 03/24/2019 8:45 AM Medical Record Number: [id number] Patient Account Number: [id number] Date of Birth/Sex: Jul 20, 1960 (58 y.o. M) Treating RN: Cornell Barman Primary Care Glendola Friedhoff: Mcneil Sober Other Clinician: Referring Farrah Skoda: Mcneil Sober Treating Leanny Moeckel/Extender: Tito Dine in Treatment: 1 Clinic Level of Care Assessment Items TOOL 4 Quantity Score []  - Use when only an EandM is performed on FOLLOW-UP visit 0 ASSESSMENTS - Nursing Assessment / Reassessment X - Reassessment of Co-morbidities (includes updates in patient status) 1 10 X- 1 5 Reassessment of  Adherence to Treatment Plan ASSESSMENTS - Wound and Skin Assessment / Reassessment X - Simple Wound Assessment / Reassessment - one wound 1 5 []  - 0 Complex Wound Assessment / Reassessment - multiple wounds []  - 0 Dermatologic / Skin Assessment (not related to wound area) ASSESSMENTS - Focused Assessment []  - Circumferential Edema Measurements - multi extremities 0 []  - 0 Nutritional Assessment / Counseling / Intervention []  - 0 Lower Extremity Assessment (monofilament, tuning fork, pulses) []  - 0 Peripheral Arterial Disease Assessment (using hand held doppler) ASSESSMENTS - Ostomy and/or Continence Assessment and Care []  - Incontinence Assessment and Management 0 []  - 0 Ostomy Care Assessment and Management (repouching, etc.) PROCESS - Coordination of Care X - Simple Patient / Family Education for ongoing care 1 15 []  - 0 Complex (extensive) Patient / Family Education for ongoing care []  - 0 Staff obtains Programmer, systems, Records, Test Results / Process Orders []  - 0 Staff telephones HHA, Nursing Homes / Clarify orders / etc []  - 0 Routine Transfer to another Facility (non-emergent condition) []  - 0 Routine Hospital Admission (non-emergent condition) []  - 0 New Admissions / Biomedical engineer / Ordering NPWT, Apligraf, etc. []  - 0 Emergency Hospital Admission (emergent condition) X- 1 10 Simple Discharge Coordination Dylan Wright, Dylan Wright ([id number]) []  - 0 Complex (extensive) Discharge Coordination PROCESS - Special Needs []  - Pediatric / Minor Patient Management 0 []  - 0 Isolation Patient Management []  - 0 Hearing / Language / Visual special needs []  - 0 Assessment of Community assistance (transportation, D/C planning, etc.) []  - 0 Additional assistance / Altered mentation []  - 0 Support Surface(s) Assessment (bed, cushion, seat, etc.) INTERVENTIONS - Wound Cleansing / Measurement X - Simple Wound Cleansing - one wound 1 5 []  - 0 Complex Wound Cleansing - multiple  wounds X- 1 5 Wound Imaging (photographs - any number of wounds) []  - 0 Wound Tracing (instead of photographs) X- 1 5 Simple Wound  Measurement - one wound []  - 0 Complex Wound Measurement - multiple wounds INTERVENTIONS - Wound Dressings []  - Small Wound Dressing one or multiple wounds 0 X- 1 15 Medium Wound Dressing one or multiple wounds []  - 0 Large Wound Dressing one or multiple wounds []  - 0 Application of Medications - topical []  - 0 Application of Medications - injection INTERVENTIONS - Miscellaneous []  - External ear exam 0 []  - 0 Specimen Collection (cultures, biopsies, blood, body fluids, etc.) []  - 0 Specimen(s) / Culture(s) sent or taken to Lab for analysis []  - 0 Patient Transfer (multiple staff / / Similar devices) []  - 0 Simple Staple / Suture removal (25 or less) []  - 0 Complex Staple / Suture removal (26 or more) []  - 0 Hypo / Hyperglycemic Management (close monitor of Blood Glucose) []  - 0 Ankle / Brachial Index (ABI) - do not check if billed separately X- 1 5 Vital Signs Dylan Wright, Dylan Wright ( ) Has the patient been seen at the hospital within the last three years: Yes Total Score: 80 Level Of Care: New/Established - Level 3 Electronic Signature(s) Signed: 03/24/2019 1:48:26 PM By: , BSN, RN, CWS, Kim RN, BSN Entered By: , BSN, RN, CWS, Kim on 03/24/2019 09:29:29 ( ) -------------------------------------------------------------------------------- Lower Extremity Assessment Details Patient Name: Dylan Wright, Dylan Wright. Date of Service: 03/24/2019 8:45 AM Medical Record Number: Patient Account Number: Date of Birth/Sex: 05-28-1961 (58 y.o. M) Treating RN: [id number] Primary Care Paislie Tessler: 03/26/2019 Other Clinician: Referring Vincent Streater: Elliot Gurney Treating Drenda Sobecki/Extender: Elliot Gurney Weeks in Treatment: 1 Vascular Assessment Pulses: Dorsalis Pedis Palpable:  [Left:Yes] Posterior Tibial Palpable: [Left:Yes] Electronic Signature(s) Signed: 03/24/2019 4:37:37 PM By: [name] Entered By: [id number] on 03/24/2019 09:03:38 03/26/2019 ([id number]) -------------------------------------------------------------------------------- Multi Wound Chart Details Patient Name: [id number]. Date of Service: 03/24/2019 8:45 AM Medical Record Number: [id number] Patient Account Number: Arnette Norris Date of Birth/Sex: [birth year] (58 y.o. M) Treating RN: Maxwell Caul Primary Care Tijuana Scheidegger: 03/26/2019 Other Clinician: Referring Nima Kemppainen: Arnette Norris Treating Naba Sneed/Extender: Arnette Norris in Treatment: 1 Vital Signs Height(in): 75 Pulse(bpm): 46 Weight(lbs): 320 Blood Pressure(mmHg): 159/85 Body Mass Index(BMI): 40 Temperature(F): 99.1 Respiratory Rate 18 (breaths/min): Photos: [N/A:N/A] Wound Location: Left Lower Leg - Anterior N/A N/A Wounding Event: Trauma N/A N/A Primary Etiology: Diabetic Wound/Ulcer of the N/A N/A Lower Extremity Comorbid History: Hypertension, Myocardial N/A N/A Infarction, Colitis, Type II Diabetes Date Acquired: 02/09/2019 N/A N/A Weeks of Treatment: 1 N/A N/A Wound Status: Open N/A N/A Measurements L x W x D 0.8x0.5x0.9 N/A N/A (cm) Area (cm) : 0.314 N/A N/A Volume (cm) : 0.283 N/A N/A % Reduction in Area: 54.60% N/A N/A % Reduction in Volume: 59.00% N/A N/A Position 1 (o'clock): 11 Maximum Distance 1 (cm): 0.9 Tunneling: Yes N/A N/A Classification: Grade 1 N/A N/A Exudate Amount: Large N/A N/A Exudate Type: Serous N/A N/A Exudate Color: amber N/A N/A Wound Margin: Flat and Intact N/A N/A Granulation Amount: Medium (34-66%) N/A N/A Granulation Quality: Pink N/A N/A Necrotic Amount: Medium (34-66%) N/A N/A Exposed Structures: Fat Layer (Subcutaneous N/A N/A Tissue) Exposed: Yes Dylan Wright, Dylan Wright ([id number]) Fascia: No Tendon: No Muscle: No Joint: No Bone:  No Epithelialization: None N/A N/A Treatment Notes Electronic Signature(s) Signed: 03/24/2019 5:10:21 PM By: 03/26/2019 MD Entered By: [id number] on 03/24/2019 09:47:48 04/25/1961 (55) -------------------------------------------------------------------------------- Multi-Disciplinary Care Plan Details Patient Name: Dylan Wright, Dylan Wright. Date of Service: 03/24/2019 8:45 AM Medical Record Number: Lucy Chris  Patient Account Number: [id number] Date of Birth/Sex: 1960/10/19 (58 y.o. M) Treating RN: Huel Coventry Primary Care Deondrea Markos: Lucy Chris Other Clinician: Referring Maurie Olesen: Lucy Chris Treating Sammantha Mehlhaff/Extender: Altamese [place] in Treatment: 1 Active Inactive Necrotic Tissue Nursing Diagnoses: Impaired tissue integrity related to necrotic/devitalized tissue Goals: Necrotic/devitalized tissue will be minimized in the wound bed Date Initiated: 03/17/2019 Target Resolution Date: 03/24/2019 Goal Status: Active Interventions: Assess patient pain level pre-, during and post procedure and prior to discharge Treatment Activities: Apply topical anesthetic as ordered : 03/17/2019 Notes: Orientation to the Wound Care Program Nursing Diagnoses: Knowledge deficit related to the wound healing center program Goals: Patient/caregiver will verbalize understanding of the Wound Healing Center Program Date Initiated: 03/17/2019 Target Resolution Date: 03/24/2019 Goal Status: Active Interventions: Provide education on orientation to the wound center Notes: Wound/Skin Impairment Nursing Diagnoses: Impaired tissue integrity Goals: Ulcer/skin breakdown will have a volume reduction of 30% by week 4 Date Initiated: 03/17/2019 Target Resolution Date: 04/17/2019 Goal Status: Active Dylan Wright, Dylan Wright ([id number]) Interventions: Provide education on ulcer and skin care Treatment Activities: Skin care regimen initiated : 03/17/2019 Topical wound management initiated :  03/17/2019 Notes: Electronic Signature(s) Signed: 03/24/2019 1:48:26 PM By: Elliot Gurney, BSN, RN, CWS, Kim RN, BSN Entered By: Elliot Gurney, BSN, RN, CWS, Kim on 03/24/2019 09:24:05 [name] ([id number]) -------------------------------------------------------------------------------- Pain Assessment Details Patient Name: Dylan Wright, Dylan Wright. Date of Service: 03/24/2019 8:45 AM Medical Record Number: [id number] Patient Account Number: [id number] Date of Birth/Sex: 1960/12/12 (58 y.o. M) Treating RN: Arnette Norris Primary Care Advay Volante: Lucy Chris Other Clinician: Referring Ellenor Wisniewski: Lucy Chris Treating Kenika Sahm/Extender: Altamese [place] in Treatment: 1 Active Problems Location of Pain Severity and Description of Pain Patient Has Paino Yes Site Locations Rate the pain. Current Pain Level: 5 Character of Pain Describe the Pain: Shooting Pain Management and Medication Current Pain Management: Electronic Signature(s) Signed: 03/24/2019 4:37:37 PM By: Arnette Norris Entered By: Arnette Norris on 03/24/2019 08:57:42 [name] ([id number]) -------------------------------------------------------------------------------- Patient/Caregiver Education Details Patient Name: Dylan Wright, Dylan Wright. Date of Service: 03/24/2019 8:45 AM Medical Record Number: [id number] Patient Account Number: [id number] Date of Birth/Gender: [birth year] (58 y.o. M) Treating RN: Huel Coventry Primary Care Physician: Lucy Chris Other Clinician: Referring Physician: Lucy Chris Treating Physician/Extender: Altamese [place] in Treatment: 1 Education Assessment Education Provided To: Patient Education Topics Provided Welcome To The Wound Care Center: Wound/Skin Impairment: Handouts: Caring for Your Ulcer, Other: Continue wound care as prescribed. Methods: Demonstration, Explain/Verbal Responses: State content correctly Electronic Signature(s) Signed: 03/24/2019 1:48:26 PM By: Elliot Gurney, BSN,  RN, CWS, Kim RN, BSN Entered By: Elliot Gurney, BSN, RN, CWS, Kim on 03/24/2019 09:30:30 [name] ([id number]) -------------------------------------------------------------------------------- Wound Assessment Details Patient Name: Dylan Wright, Dylan Wright. Date of Service: 03/24/2019 8:45 AM Medical Record Number: [id number] Patient Account Number: [id number] Date of Birth/Sex: [birth year] (58 y.o. M) Treating RN: Arnette Norris Primary Care Emylie Amster: Lucy Chris Other Clinician: Referring Tailyn Hantz: Lucy Chris Treating Thayer Inabinet/Extender: Altamese [place] in Treatment: 1 Wound Status Wound Number: 1 Primary Diabetic Wound/Ulcer of the Lower Extremity Etiology: Wound Location: Left Lower Leg - Anterior Wound Status: Open Wounding Event: Trauma Comorbid Hypertension, Myocardial Infarction, Colitis, Date Acquired: 02/09/2019 History: Type II Diabetes Weeks Of Treatment: 1 Clustered Wound: No Photos Wound Measurements Length: (cm) 0.8 % Reduction Width: (cm) 0.5 % Reduction Depth: (cm) 0.9 Epithelializ Area: (cm) 0.314 Tunneling: Volume: (cm) 0.283 Position Maximum D in Area: 54.6% in Volume: 59% ation: None Yes (o'clock): 11 istance: (cm) 0.9 Undermining: No Wound Description Classification:  Grade 1 Foul Odor Af Wound Margin: Flat and Intact Slough/Fibri Exudate Amount: Large Exudate Type: Serous Exudate Color: amber ter Cleansing: No no Yes Wound Bed Granulation Amount: Medium (34-66%) Exposed Structure Granulation Quality: Pink Fascia Exposed: No Necrotic Amount: Medium (34-66%) Fat Layer (Subcutaneous Tissue) Exposed: Yes Necrotic Quality: Adherent Slough Tendon Exposed: No Muscle Exposed: No Joint Exposed: No Bone Exposed: No [name], Json D. ([id number]) Electronic Signature(s) Signed: 03/24/2019 4:37:37 PM By: Arnette NorrisBiell, Kristina Entered By: Arnette NorrisBiell, Kristina on 03/24/2019 09:03:02 [name], Lyon D.  ([id number]) -------------------------------------------------------------------------------- Vitals Details Patient Name: [name], Dewie D. Date of Service: 03/24/2019 8:45 AM Medical Record Number: [id number] Patient Account Number: [id number] Date of Birth/Sex: Dec 13, 1960 (58 y.o. M) Treating RN: Arnette NorrisBiell, Kristina Primary Care Brylin Stanislawski: Lucy ChrisGOERES, LINDSEY Other Clinician: Referring Shakiya Mcneary: Lucy ChrisGOERES, LINDSEY Treating Chaniya Genter/Extender: Altamese CarolinaOBSON, MICHAEL G Weeks in Treatment: 1 Vital Signs Time Taken: 08:55 Temperature (F): 99.1 Height (in): 75 Pulse (bpm): 46 Weight (lbs): 320 Respiratory Rate (breaths/min): 18 Body Mass Index (BMI): 40 Blood Pressure (mmHg): 159/85 Reference Range: 80 - 120 mg / dl Electronic Signature(s) Signed: 03/24/2019 4:37:37 PM By: Arnette NorrisBiell, Kristina Entered By: Arnette NorrisBiell, Kristina on 03/24/2019 08:58:36

## 2019-03-31 ENCOUNTER — Encounter: Payer: Medicare Other | Admitting: Internal Medicine

## 2019-03-31 ENCOUNTER — Other Ambulatory Visit: Payer: Self-pay

## 2019-03-31 DIAGNOSIS — E11622 Type 2 diabetes mellitus with other skin ulcer: Secondary | ICD-10-CM | POA: Diagnosis not present

## 2019-03-31 NOTE — Progress Notes (Addendum)
[place], PECHACEK ([id number]) Visit Report for 03/31/2019 Arrival Information Details Patient Name: Dylan Wright, Dylan Wright. Date of Service: 03/31/2019 3:00 PM Medical Record Number: [id number] Patient Account Number: [id number] Date of Birth/Sex: 06/23/1960 (58 y.o. M) Treating RN: Rodell Perna Primary Care Keelia Graybill: Lucy Chris Other Clinician: Referring Kelita Wallis: Lucy Chris Treating Chevy Virgo/Extender: Altamese [place] in Treatment: 2 Visit Information History Since Last Visit Added or deleted any medications: No Patient Arrived: Cane Any new allergies or adverse reactions: No Arrival Time: 15:08 Had a fall or experienced change in No Accompanied By: wife activities of daily living that may affect Transfer Assistance: None risk of falls: Patient Identification Verified: Yes Signs or symptoms of abuse/neglect since last visito No Has Dressing in Place as Prescribed: Yes Pain Present Now: No Electronic Signature(s) Signed: 03/31/2019 3:15:35 PM By: Rodell Perna Entered By: Rodell Perna on 03/31/2019 15:10:08 Dylan Wright ([id number]) -------------------------------------------------------------------------------- Clinic Level of Care Assessment Details Patient Name: Dylan Wright. Date of Service: 03/31/2019 3:00 PM Medical Record Number: [id number] Patient Account Number: [id number] Date of Birth/Sex: [birth year] (58 y.o. M) Treating RN: Huel Coventry Primary Care Karletta Millay: Lucy Chris Other Clinician: Referring Aahana Elza: Lucy Chris Treating Takiera Mayo/Extender: Altamese [place] in Treatment: 2 Clinic Level of Care Assessment Items TOOL 4 Quantity Score []  - Use when only an EandM is performed on FOLLOW-UP visit 0 ASSESSMENTS - Nursing Assessment / Reassessment X - Reassessment of Co-morbidities (includes updates in patient status) 1 10 X- 1 5 Reassessment of Adherence to Treatment Plan ASSESSMENTS - Wound and Skin Assessment / Reassessment X - Simple  Wound Assessment / Reassessment - one wound 1 5 []  - 0 Complex Wound Assessment / Reassessment - multiple wounds []  - 0 Dermatologic / Skin Assessment (not related to wound area) ASSESSMENTS - Focused Assessment []  - Circumferential Edema Measurements - multi extremities 0 []  - 0 Nutritional Assessment / Counseling / Intervention []  - 0 Lower Extremity Assessment (monofilament, tuning fork, pulses) []  - 0 Peripheral Arterial Disease Assessment (using hand held doppler) ASSESSMENTS - Ostomy and/or Continence Assessment and Care []  - Incontinence Assessment and Management 0 []  - 0 Ostomy Care Assessment and Management (repouching, etc.) PROCESS - Coordination of Care X - Simple Patient / Family Education for ongoing care 1 15 []  - 0 Complex (extensive) Patient / Family Education for ongoing care X- 1 10 Staff obtains , Records, Test Results / Process Orders []  - 0 Staff telephones HHA, Nursing Homes / Clarify orders / etc []  - 0 Routine Transfer to another Facility (non-emergent condition) []  - 0 Routine Hospital Admission (non-emergent condition) []  - 0 New Admissions / / Ordering NPWT, Apligraf, etc. []  - 0 Emergency Hospital Admission (emergent condition) X- 1 10 Simple Discharge Coordination AMYR, SLUDER ( ) []  - 0 Complex (extensive) Discharge Coordination PROCESS - Special Needs []  - Pediatric / Minor Patient Management 0 []  - 0 Isolation Patient Management []  - 0 Hearing / Language / Visual special needs []  - 0 Assessment of Community assistance (transportation, D/C planning, etc.) []  - 0 Additional assistance / Altered mentation []  - 0 Support Surface(s) Assessment (bed, cushion, seat, etc.) INTERVENTIONS - Wound Cleansing / Measurement X - Simple Wound Cleansing - one wound 1 5 []  - 0 Complex Wound Cleansing - multiple wounds X- 1 5 Wound Imaging (photographs - any number of wounds) []  - 0 Wound Tracing  (instead of photographs) X- 1 5 Simple Wound Measurement - one wound []  - 0 Complex Wound Measurement -  multiple wounds INTERVENTIONS - Wound Dressings []  - Small Wound Dressing one or multiple wounds 0 X- 1 15 Medium Wound Dressing one or multiple wounds []  - 0 Large Wound Dressing one or multiple wounds []  - 0 Application of Medications - topical []  - 0 Application of Medications - injection INTERVENTIONS - Miscellaneous []  - External ear exam 0 []  - 0 Specimen Collection (cultures, biopsies, blood, body fluids, etc.) []  - 0 Specimen(s) / Culture(s) sent or taken to Lab for analysis []  - 0 Patient Transfer (multiple staff / Nurse, adultHoyer Lift / Similar devices) []  - 0 Simple Staple / Suture removal (25 or less) []  - 0 Complex Staple / Suture removal (26 or more) []  - 0 Hypo / Hyperglycemic Management (close monitor of Blood Glucose) []  - 0 Ankle / Brachial Index (ABI) - do not check if billed separately X- 1 5 Vital Signs Dylan GalasGRANT, Dylan D. ([id number]) Has the patient been seen at the hospital within the last three years: Yes Total Score: 90 Level Of Care: New/Established - Level 3 Electronic Signature(s) Signed: 03/31/2019 4:31:46 PM By: Elliot GurneyWoody, BSN, RN, CWS, Kim RN, BSN Entered By: Elliot GurneyWoody, BSN, RN, CWS, Kim on 03/31/2019 15:28:58 Dylan GalasGRANT, Dylan D. ([id number]) -------------------------------------------------------------------------------- Encounter Discharge Information Details Patient Name: Dylan GalasGRANT, Dylan D. Date of Service: 03/31/2019 3:00 PM Medical Record Number: [id number] Patient Account Number: [id number] Date of Birth/Sex: 1960/06/17 (58 y.o. M) Treating RN: Huel CoventryWoody, Kim Primary Care Cullin Dishman: Lucy ChrisGOERES, LINDSEY Other Clinician: Referring Marijean Montanye: Lucy ChrisGOERES, LINDSEY Treating Kaliana Albino/Extender: Altamese CarolinaOBSON, MICHAEL G Weeks in Treatment: 2 Encounter Discharge Information Items Discharge Condition: Stable Ambulatory Status: Ambulatory Discharge Destination: Home Transportation:  Private Auto Accompanied By: self Schedule Follow-up Appointment: Yes Clinical Summary of Care: Electronic Signature(s) Signed: 03/31/2019 4:31:46 PM By: Elliot GurneyWoody, BSN, RN, CWS, Kim RN, BSN Entered By: Elliot GurneyWoody, BSN, RN, CWS, Kim on 03/31/2019 15:29:48 Dylan GalasGRANT, Dylan D. ([id number]) -------------------------------------------------------------------------------- Lower Extremity Assessment Details Patient Name: Dylan GalasGRANT, Dylan D. Date of Service: 03/31/2019 3:00 PM Medical Record Number: [id number] Patient Account Number: [id number] Date of Birth/Sex: 1960/06/17 (58 y.o. M) Treating RN: Rodell PernaScott, Dajea Primary Care Mordechai Matuszak: Lucy ChrisGOERES, LINDSEY Other Clinician: Referring Ladell Lea: Lucy ChrisGOERES, LINDSEY Treating Craige Patel/Extender: Maxwell CaulOBSON, MICHAEL G Weeks in Treatment: 2 Edema Assessment Assessed: [Left: No] [Right: No] Edema: [Left: N] [Right: o] Vascular Assessment Pulses: Dorsalis Pedis Palpable: [Left:Yes] Electronic Signature(s) Signed: 03/31/2019 3:15:35 PM By: Rodell PernaScott, Dajea Entered By: Rodell PernaScott, Dajea on 03/31/2019 15:14:23 Dylan GalasGRANT, Blayden D. ([id number]) -------------------------------------------------------------------------------- Multi Wound Chart Details Patient Name: Dylan GalasGRANT, Dylan D. Date of Service: 03/31/2019 3:00 PM Medical Record Number: [id number] Patient Account Number: [id number] Date of Birth/Sex: 1960/06/17 (58 y.o. M) Treating RN: Huel CoventryWoody, Kim Primary Care Gayatri Teasdale: Lucy ChrisGOERES, LINDSEY Other Clinician: Referring Shizue Kaseman: Lucy ChrisGOERES, LINDSEY Treating Draedyn Weidinger/Extender: Altamese CarolinaOBSON, MICHAEL G Weeks in Treatment: 2 Vital Signs Height(in): 75 Pulse(bpm): 48 Weight(lbs): 320 Blood Pressure(mmHg): 134/80 Body Mass Index(BMI): 40 Temperature(F): 98.5 Respiratory Rate 16 (breaths/min): Photos: [N/A:N/A] Wound Location: Left Lower Leg - Anterior N/A N/A Wounding Event: Trauma N/A N/A Primary Etiology: Diabetic Wound/Ulcer of the N/A N/A Lower Extremity Comorbid History: Hypertension, Myocardial N/A  N/A Infarction, Colitis, Type II Diabetes Date Acquired: 02/09/2019 N/A N/A Weeks of Treatment: 2 N/A N/A Wound Status: Open N/A N/A Measurements L x W x D 0.7x0.4x0.8 N/A N/A (cm) Area (cm) : 0.22 N/A N/A Volume (cm) : 0.176 N/A N/A % Reduction in Area: 68.20% N/A N/A % Reduction in Volume: 74.50% N/A N/A Position 1 (o'clock): 11 Maximum Distance 1 (cm): 0.9 Tunneling: Yes N/A N/A Classification: Grade 1 N/A N/A Exudate Amount: Large  N/A N/A Exudate Type: Serous N/A N/A Exudate Color: amber N/A N/A Wound Margin: Flat and Intact N/A N/A Granulation Amount: Medium (34-66%) N/A N/A Granulation Quality: Pink N/A N/A Necrotic Amount: Medium (34-66%) N/A N/A Exposed Structures: Fat Layer (Subcutaneous N/A N/A Tissue) Exposed: Yes Dylan Wright, [place] ([id number]) Fascia: No Tendon: No Muscle: No Joint: No Bone: No Epithelialization: None N/A N/A Treatment Notes Wound #1 (Left, Anterior Lower Leg) Notes Silver Cell, packed lightly into wound and BFD Electronic Signature(s) Signed: 03/31/2019 5:06:38 PM By: Linton Ham MD Previous Signature: 03/31/2019 4:31:46 PM Version By: Gretta Cool, BSN, RN, CWS, Kim RN, BSN Entered By: Linton Ham on 03/31/2019 17:01:08 [name] ([id number]) -------------------------------------------------------------------------------- Multi-Disciplinary Care Plan Details Patient Name: Dylan Wright, MASELLI. Date of Service: 03/31/2019 3:00 PM Medical Record Number: [id number] Patient Account Number: [id number] Date of Birth/Sex: [birth year] (58 y.o. M) Treating RN: Cornell Barman Primary Care Trygg Mantz: Mcneil Sober Other Clinician: Referring Shanikqua Zarzycki: Mcneil Sober Treating Jeananne Bedwell/Extender: Tito Dine in Treatment: 2 Active Inactive Necrotic Tissue Nursing Diagnoses: Impaired tissue integrity related to necrotic/devitalized tissue Goals: Necrotic/devitalized tissue will be minimized in the wound bed Date Initiated: 03/17/2019 Target  Resolution Date: 03/24/2019 Goal Status: Active Interventions: Assess patient pain level pre-, during and post procedure and prior to discharge Treatment Activities: Apply topical anesthetic as ordered : 03/17/2019 Notes: Orientation to the Wound Care Program Nursing Diagnoses: Knowledge deficit related to the wound healing center program Goals: Patient/caregiver will verbalize understanding of the [facility] Program Date Initiated: 03/17/2019 Target Resolution Date: 03/24/2019 Goal Status: Active Interventions: Provide education on orientation to the wound center Notes: Wound/Skin Impairment Nursing Diagnoses: Impaired tissue integrity Goals: Ulcer/skin breakdown will have a volume reduction of 30% by week 4 Date Initiated: 03/17/2019 Target Resolution Date: 04/17/2019 Goal Status: Active Dylan Wright, Dylan Wright ([id number]) Interventions: Provide education on ulcer and skin care Treatment Activities: Skin care regimen initiated : 03/17/2019 Topical wound management initiated : 03/17/2019 Notes: Electronic Signature(s) Signed: 03/31/2019 4:31:46 PM By: Gretta Cool, BSN, RN, CWS, Kim RN, BSN Entered By: Gretta Cool, BSN, RN, CWS, Kim on 03/31/2019 15:26:35 [name] ([id number]) -------------------------------------------------------------------------------- Pain Assessment Details Patient Name: Dylan Wright, ZINGARO. Date of Service: 03/31/2019 3:00 PM Medical Record Number: [id number] Patient Account Number: [id number] Date of Birth/Sex: 12-25-1960 (58 y.o. M) Treating RN: Army Melia Primary Care Jaclene Bartelt: Mcneil Sober Other Clinician: Referring Cj Beecher: Mcneil Sober Treating Alvie Speltz/Extender: Tito Dine in Treatment: 2 Active Problems Location of Pain Severity and Description of Pain Patient Has Paino Yes Site Locations Pain Location: Pain in Ulcers Rate the pain. Current Pain Level: 8 Pain Management and Medication Current Pain Management: Electronic  Signature(s) Signed: 03/31/2019 3:15:35 PM By: Army Melia Entered By: Army Melia on 03/31/2019 15:10:29 [name] ([id number]) -------------------------------------------------------------------------------- Patient/Caregiver Education Details Patient Name: Dylan Wright, NGUYENTHI. Date of Service: 03/31/2019 3:00 PM Medical Record Number: [id number] Patient Account Number: [id number] Date of Birth/Gender: 12-Nov-1960 (58 y.o. M) Treating RN: Cornell Barman Primary Care Physician: Mcneil Sober Other Clinician: Referring Physician: Mcneil Sober Treating Physician/Extender: Tito Dine in Treatment: 2 Education Assessment Education Provided To: Patient Education Topics Provided Wound/Skin Impairment: Handouts: Caring for Your Ulcer Methods: Demonstration, Explain/Verbal Responses: State content correctly Electronic Signature(s) Signed: 03/31/2019 4:31:46 PM By: Gretta Cool, BSN, RN, CWS, Kim RN, BSN Entered By: Gretta Cool, BSN, RN, CWS, Kim on 03/31/2019 15:29:18 [name] ([id number]) -------------------------------------------------------------------------------- Wound Assessment Details Patient Name: Dylan Wright, Dylan Wright. Date of Service: 03/31/2019 3:00 PM Medical Record Number: [id number] Patient Account Number: [id number]  Date of Birth/Sex: 01/04/1961 (58 y.o. M) Treating RN: Rodell Perna Primary Care Astraea Gaughran: Lucy Chris Other Clinician: Referring Leny Morozov: Lucy Chris Treating Drury Ardizzone/Extender: Altamese [place] in Treatment: 2 Wound Status Wound Number: 1 Primary Diabetic Wound/Ulcer of the Lower Extremity Etiology: Wound Location: Left Lower Leg - Anterior Wound Status: Open Wounding Event: Trauma Comorbid Hypertension, Myocardial Infarction, Colitis, Date Acquired: 02/09/2019 History: Type II Diabetes Weeks Of Treatment: 2 Clustered Wound: No Photos Wound Measurements Length: (cm) 0.7 % Reduction Width: (cm) 0.4 % Reduction Depth: (cm) 0.8  Epithelializ Area: (cm) 0.22 Tunneling: Volume: (cm) 0.176 Position Maximum D in Area: 68.2% in Volume: 74.5% ation: None Yes (o'clock): 11 istance: (cm) 0.9 Undermining: No Wound Description Classification: Grade 1 Foul Odor Af Wound Margin: Flat and Intact Slough/Fibri Exudate Amount: Large Exudate Type: Serous Exudate Color: amber ter Cleansing: No no Yes Wound Bed Granulation Amount: Medium (34-66%) Exposed Structure Granulation Quality: Pink Fascia Exposed: No Necrotic Amount: Medium (34-66%) Fat Layer (Subcutaneous Tissue) Exposed: Yes Necrotic Quality: Adherent Slough Tendon Exposed: No Muscle Exposed: No Joint Exposed: No Bone Exposed: No DRAE, MITZEL ([id number]) Treatment Notes Wound #1 (Left, Anterior Lower Leg) Notes Silver Cell, packed lightly into wound and BFD Electronic Signature(s) Signed: 04/01/2019 1:28:54 PM By: Rodell Perna Previous Signature: 03/31/2019 4:16:59 PM Version By: Rodell Perna Previous Signature: 03/31/2019 3:15:35 PM Version By: Rodell Perna Entered By: Rodell Perna on 03/31/2019 16:21:17 Dylan Wright ([id number]) -------------------------------------------------------------------------------- Vitals Details Patient Name: Dylan Wright. Date of Service: 03/31/2019 3:00 PM Medical Record Number: [id number] Patient Account Number: [id number] Date of Birth/Sex: April 20, 1961 (59 y.o. M) Treating RN: Rodell Perna Primary Care Eymi Lipuma: Lucy Chris Other Clinician: Referring Malkie Wille: Lucy Chris Treating Maisha Bogen/Extender: Altamese [place] in Treatment: 2 Vital Signs Time Taken: 15:10 Temperature (F): 98.5 Height (in): 75 Pulse (bpm): 48 Weight (lbs): 320 Respiratory Rate (breaths/min): 16 Body Mass Index (BMI): 40 Blood Pressure (mmHg): 134/80 Reference Range: 80 - 120 mg / dl Electronic Signature(s) Signed: 03/31/2019 3:15:35 PM By: Rodell Perna Entered By: Rodell Perna on 03/31/2019 15:11:18

## 2019-04-01 NOTE — Progress Notes (Signed)
[name], [name] ([id number]) Visit Report for 03/31/2019 HPI Details Patient Name: Dylan Wright, Dylan Wright. Date of Service: 03/31/2019 3:00 PM Medical Record Number: [id number] Patient Account Number: [id number] Date of Birth/Sex: December 17, 1960 (58 y.o. M) Treating RN: Cornell Barman Primary Care Provider: Mcneil Sober Other Clinician: Referring Provider: Mcneil Sober Treating Provider/Extender: Tito Dine in Treatment: 2 History of Present Illness HPI Description: ADMISSION 03/17/2019 This is a 58 year old diabetic man who was cutting his lawn of roughly a month ago. A small rock spun out and hit him in the left lower leg. There was immediate superficial injury. He said the pain by that night was quite significant. Saw his primary doctor's office at [facility] clinic in [place] apparently a culture was done and the patient was prescribed 15 days of doxycycline which he just finished 3 days ago. This is not made any difference. There is a fair amount of drainage still tenderness and aching pain. He has been only covering this with a Band-Aid. 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 X-ray did not show any bone damage. I am going to try to get a result of the culture that his primary doctor did just to ensure that it was negative. He was empirically given doxycycline. He is still complaining of a lot of pain. Using silver alginate 10/21. I was able to confirm with his primary doctor's office through my chart on the patient's phone that the original culture was negative. He only started the cephalexin 3 days ago therefore he has enough until Sunday. I think the erythema  and swelling around the wound is better. We are using silver alginate. Electronic Signature(s) Signed: 03/31/2019 5:06:38 PM By: Linton Ham MD Entered By: Linton Ham on 03/31/2019 17:01:53 Dylan Wright ([id number]) -------------------------------------------------------------------------------- Physical Exam Details Patient Name: Dylan Wright, Dylan Wright. Date of Service: 03/31/2019 3:00 PM Medical Record Number: [id number] Patient Account Number: [id number] Date of Birth/Sex: 1960-08-24 (58 y.o. M) Treating RN: Cornell Barman Primary Care Provider: Mcneil Sober Other Clinician: Referring Provider: Mcneil Sober Treating Provider/Extender: Tito Dine in Treatment: 2 Constitutional Sitting or standing Blood Pressure is within target range for patient.. Pulse regular and within target range for patient.Marland Kitchen Respirations regular, non-labored and within target range.. Temperature is normal and within the target range for the patient.Marland Kitchen appears in no distress. Notes Wound exam; the area in question is a small open area on the lower left tibia. This has depth and probes superiorly. The improvement has been no palpable bone. Carefully examining the wound with a Q-tip shows no evidence of any foreign debris that I can feel. There is no exposed bone. The erythema around the wound is considerably better but still present and also less tender there is no crepitus Electronic Signature(s) Signed: 03/31/2019 5:06:38 PM By: Linton Ham MD Entered By: Linton Ham on 03/31/2019 17:02:53 Dylan Wright ([id number]) -------------------------------------------------------------------------------- Physician Orders Details Patient Name: Dylan Wright, Dylan Wright. Date of Service: 03/31/2019 3:00 PM Medical Record Number: [id number] Patient Account Number: [id number] Date of Birth/Sex: 30-Oct-1960 (58 y.o. M) Treating RN: Cornell Barman Primary Care Provider: Mcneil Sober Other Clinician: Referring  Provider: Mcneil Sober Treating Provider/Extender: Tito Dine in Treatment: 2 Verbal / Phone Orders: No Diagnosis Coding  Wound Cleansing Wound #1 Left,Anterior Lower Leg o May Shower, gently pat wound dry prior to applying new dressing. Anesthetic (add to Medication List) Wound #1 Left,Anterior Lower Leg o Topical Lidocaine 4% cream applied to wound bed prior to debridement (In Clinic Only). Primary Wound Dressing Wound #1 Left,Anterior Lower Leg o Silver Alginate - Pack strip lightly into wound Secondary Dressing Wound #1 Left,Anterior Lower Leg o Boardered Foam Dressing Dressing Change Frequency Wound #1 Left,Anterior Lower Leg o Change dressing every day. Follow-up Appointments Wound #1 Left,Anterior Lower Leg o Return Appointment in 1 week. Edema Control Wound #1 Left,Anterior Lower Leg o Elevate legs to the level of the heart and pump ankles as often as possible Medications-please add to medication list. Wound #1 Left,Anterior Lower Leg o P.O. Antibiotics Electronic Signature(s) Signed: 03/31/2019 4:31:46 PM By: Elliot Gurney, BSN, RN, CWS, Kim RN, BSN Signed: 03/31/2019 5:06:38 PM By: Baltazar Najjar MD Entered By: Elliot Gurney, BSN, RN, CWS, Kim on 03/31/2019 15:28:19 [name], [name] ([id number]) -------------------------------------------------------------------------------- Problem List Details Patient Name: Dylan Wright, Dylan Wright. Date of Service: 03/31/2019 3:00 PM Medical Record Number: [id number] Patient Account Number: [id number] Date of Birth/Sex: [birth year] (58 y.o. 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 Date of Service: 03/31/2019 3:00 PM Medical Record Number: [id number] Patient Account Number: [id number] Date of Birth/Sex: [birth year] (58 y.o. M) Treating RN: Huel Coventry Primary Care Provider: Lucy Chris Other Clinician: Referring Provider: Lucy Chris Treating Provider/Extender: Altamese [place] in Treatment: 2 Subjective History of Present Illness (HPI) ADMISSION 03/17/2019 This is a 58 year old diabetic man who was cutting his lawn of roughly a month ago. A small rock spun out and hit him in the left lower leg. There was immediate superficial injury. He said the pain by that night was quite significant. Saw his primary doctor's office at [facility] in [place] apparently a culture was done and the patient was prescribed 15 days of doxycycline which he just finished 3 days ago. This is not made any difference. There is a fair amount of drainage still tenderness and aching pain. He has been only covering this with a Band-Aid. 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 X-ray did not show any bone damage. I  am going to try to get a result of the culture that his primary doctor did just to ensure that it was negative. He was empirically given doxycycline. He is still complaining of a lot of pain. Using silver alginate 10/21. I was able to confirm with his primary doctor's office through my chart on the patient's phone that  the original culture was negative. He only started the cephalexin 3 days ago therefore he has enough until Sunday. I think the erythema and swelling around the wound is better. We are using silver alginate. Objective Constitutional Sitting or standing Blood Pressure is within target range for patient.. Pulse regular and within target range for patient.Marland Kitchen. Respirations regular, non-labored and within target range.. Temperature is normal and within the target range for the patient.Marland Kitchen. appears in no distress. Vitals Time Taken: 3:10 PM, Height: 75 in, Weight: 320 lbs, BMI: 40, Temperature: 98.5 F, Pulse: 48 bpm, Respiratory Rate: 16 breaths/min, Blood Pressure: 134/80 mmHg. General Notes: Wound exam; the area in question is a small open area on the lower left tibia. This has depth and probes superiorly. The improvement has been no palpable bone. Carefully examining the wound with a Q-tip shows no evidence of any foreign debris that I can feel. There is no exposed bone. The erythema around the wound is considerably better but still present and also less tender there is no crepitus Dylan Wright, Dylan D. ([id number]) Integumentary (Hair, Skin) Wound #1 status is Open. Original cause of wound was Trauma. The wound is located on the Left,Anterior Lower Leg. The wound measures 0.7cm length x 0.4cm width x 0.8cm depth; 0.22cm^2 area and 0.176cm^3 volume. There is Fat Layer (Subcutaneous Tissue) Exposed exposed. There is no undermining noted, however, there is tunneling at 11:00 with a maximum distance of 0.9cm. There is a large amount of serous drainage noted. The wound margin is flat and intact.  There is medium (34-66%) pink granulation within the wound bed. There is a medium (34-66%) amount of necrotic tissue within the wound bed including Adherent Slough. 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 Anesthetic (add to Medication List): Wound #1 Left,Anterior Lower Leg: Topical Lidocaine 4% cream applied to wound bed prior to debridement (In Clinic Only). Primary Wound Dressing: Wound #1 Left,Anterior Lower Leg: Silver Alginate - Pack strip lightly into wound Secondary Dressing: Wound #1 Left,Anterior Lower Leg: Boardered Foam Dressing Dressing Change Frequency: Wound #1 Left,Anterior Lower Leg: Change dressing every day. Follow-up Appointments: Wound #1 Left,Anterior Lower Leg: Return Appointment in 1 week. Edema Control: Wound #1 Left,Anterior Lower Leg: Elevate legs to the level of the heart and pump ankles as often as possible Medications-please add to medication list.: Wound #1 Left,Anterior Lower Leg: P.O. Antibiotics Dylan Wright, Dylan D. ([id number]) 1. Silver silver alginate packed into the wound 2. He has halfway through the cephalexin I gave him. So far no positive cultures to my knowledge. Plain x-rays of this area did not show bone infection or foreign bodies 3. I think the tenderness is improved. Electronic Signature(s) Signed: 03/31/2019 5:06:38 PM By: Baltazar Najjarobson, Vanette Noguchi MD Entered By: Baltazar Najjarobson, Elenie Coven on 03/31/2019 17:03:57 Dylan Wright, Dylan D. ([id number]) -------------------------------------------------------------------------------- SuperBill Details Patient Name: Dylan Wright, Dylan D. Date of Service: 03/31/2019 Medical Record  Number: [id number] Patient Account Number: [id number] Date of Birth/Sex: 06-Mar-1961 (58 y.o. 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  S81.812D Laceration without foreign body, left lower leg, subsequent encounter L03.116 Cellulitis of left lower limb E11.622 Type 2 diabetes mellitus with other skin ulcer Facility Procedures CPT4 Code: 16109604 Description: 99213 - WOUND CARE VISIT-LEV 3 EST PT Modifier: Quantity: 1 Physician Procedures CPT4 Code Description: [id number] 91478 - WC PHYS LEVEL 2 - EST PT ICD-10 Diagnosis Description L97.824 Non-pressure chronic ulcer of other part of left lower leg wi S81.812D Laceration without foreign body, left lower leg, subsequent e Modifier: th necrosis of bon ncounter Quantity: 1 e Electronic Signature(s) Signed: 03/31/2019 5:06:38 PM By: Baltazar Najjar MD Entered By: Baltazar Najjar on 03/31/2019 17:04:20

## 2019-04-07 ENCOUNTER — Other Ambulatory Visit: Payer: Self-pay | Admitting: Internal Medicine

## 2019-04-07 ENCOUNTER — Ambulatory Visit
Admission: RE | Admit: 2019-04-07 | Discharge: 2019-04-07 | Disposition: A | Discharge status: Home / Self Care | Payer: Medicare Other | Source: Ambulatory Visit | Attending: Internal Medicine | Admitting: Internal Medicine

## 2019-04-07 ENCOUNTER — Encounter: Payer: Medicare Other | Admitting: Internal Medicine

## 2019-04-07 ENCOUNTER — Other Ambulatory Visit: Payer: Self-pay

## 2019-04-07 DIAGNOSIS — E11622 Type 2 diabetes mellitus with other skin ulcer: Secondary | ICD-10-CM | POA: Diagnosis not present

## 2019-04-07 DIAGNOSIS — B999 Unspecified infectious disease: Secondary | ICD-10-CM | POA: Insufficient documentation

## 2019-04-07 DIAGNOSIS — T148XXA Other injury of unspecified body region, initial encounter: Secondary | ICD-10-CM | POA: Diagnosis present

## 2019-04-08 NOTE — Progress Notes (Signed)
ISAID, SALVIA ([id number]) Visit Report for 04/07/2019 HPI Details Patient Name: Dylan Wright, Dylan Wright. Date of Service: 04/07/2019 9:30 AM Medical Record Number: [id number] Patient Account Number: [id number] Date of Birth/Sex: 05-22-1961 (58 y.o. M) Treating RN: Cornell Barman Primary Care Provider: Mcneil Sober Other Clinician: Referring Provider: Mcneil Sober Treating Provider/Extender: Tito Dine in Treatment: 3 History of Present Illness HPI Description: ADMISSION 03/17/2019 This is a 58 year old diabetic man who was cutting his lawn of roughly a month ago. A small rock spun out and hit him in the left lower leg. There was immediate superficial injury. He said the pain by that night was quite significant. Saw his primary doctor's office at [facility] clinic in [place] apparently a culture was done and the patient was prescribed 15 days of doxycycline which he just finished 3 days ago. This is not made any difference. There is a fair amount of drainage still tenderness and aching pain. He has been only covering this with a Band-Aid. 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 X-ray did not show any bone damage. I am going to try to get a result of the culture that his primary doctor did just to ensure that it was negative. He was empirically given doxycycline. He is still complaining of a lot of pain. Using silver alginate 10/21. I was able to confirm with his primary doctor's office through my chart on the patient's phone that the original culture was negative. He only started the cephalexin 3 days ago therefore he has enough until Sunday. I think the erythema  and swelling around the wound is better. We are using silver alginate. 10/28; the wound looks somewhat better. There is less swelling and less erythema but it is certainly not gone. The wound itself is about 50% granulated there is still a probing depth from about 6-12 o'clock but it does not go to bone there is still significant tenderness medially. None of the cultures that we have done have been positive either here or in primary doctor's office. I did a single x-ray of this area that was negative. There was no bone damage or foreign object. The original injury happened when he was cutting his grass and a rock projectile hit his leg. He has received courses of doxycycline before he came here and then cephalexin that I gave him even though as mentioned there was never a positive culture. I thought the cephalexin it resulted in less erythema and less swelling but certainly not resolved it. Electronic Signature(s) Signed: 04/07/2019 5:54:02 PM By: Linton Ham MD Entered By: Linton Ham on 04/07/2019 16:28:09 [name] ([id number]) -------------------------------------------------------------------------------- Physical Exam Details Patient Name: Dylan Wright, Dylan Wright. Date of Service: 04/07/2019 9:30 AM Medical Record Number: [id number] Patient Account Number: [id number] Date of Birth/Sex: 1960-06-29 (58 y.o. M) Treating RN: Cornell Barman Primary Care Provider: Mcneil Sober Other Clinician: Referring Provider: Mcneil Sober Treating Provider/Extender: Tito Dine in Treatment: 3 Constitutional Patient is hypertensive.. Pulse regular and within target range for patient.Marland Kitchen Respirations regular, non-labored and within target range.. Temperature is normal and within the target range for the patient.Marland Kitchen appears in no distress. Notes Wound exam; the area in question is a small open wound in the left tibia.  This has depth and now probes medially but no bone is palpable. The  erythema around the wound I think is somewhat improved. There is still marked tenderness especially medially and not necessarily on the areas medially that is erythematous Electronic Signature(s) Signed: 04/07/2019 5:54:02 PM By: Baltazar Najjar MD Entered By: Baltazar Najjar on 04/07/2019 16:29:23 Dylan Wright ([id number]) -------------------------------------------------------------------------------- Physician Orders Details Patient Name: Dylan Wright, Dylan Wright. Date of Service: 04/07/2019 9:30 AM Medical Record Number: [id number] Patient Account Number: [id number] Date of Birth/Sex: May 04, 1961 (59 y.o. M) Treating RN: Huel Coventry Primary Care Provider: Lucy Chris Other Clinician: Referring Provider: Lucy Chris Treating Provider/Extender: Altamese [place] in Treatment: 3 Verbal / Phone Orders: No Diagnosis Coding Wound Cleansing Wound #1 Left,Anterior Lower Leg o May Shower, gently pat wound dry prior to applying new dressing. Anesthetic (add to Medication List) Wound #1 Left,Anterior Lower Leg o Topical Lidocaine 4% cream applied to wound bed prior to debridement (In Clinic Only). Primary Wound Dressing Wound #1 Left,Anterior Lower Leg o Silver Collagen - Pack strip lightly into wound Secondary Dressing Wound #1 Left,Anterior Lower Leg o Boardered Foam Dressing Dressing Change Frequency Wound #1 Left,Anterior Lower Leg o Change dressing every other day. Follow-up Appointments Wound #1 Left,Anterior Lower Leg o Return Appointment in 1 week. Edema Control Wound #1 Left,Anterior Lower Leg o Elevate legs to the level of the heart and pump ankles as often as possible Electronic Signature(s) Signed: 04/07/2019 5:54:02 PM By: Baltazar Najjar MD Signed: 04/08/2019 10:04:30 AM By: Elliot Gurney, BSN, RN, CWS, Kim RN, BSN Entered By: Elliot Gurney, BSN, RN, CWS, Kim on 04/07/2019 10:11:01 Dylan Wright  ([id number]) -------------------------------------------------------------------------------- Problem List Details Patient Name: Dylan Wright, Dylan Wright. Date of Service: 04/07/2019 9:30 AM Medical Record Number: [id number] Patient Account Number: [id number] Date of Birth/Sex: [birth year] (58 y.o. 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 Date of Service: 04/07/2019 9:30 AM Medical Record Number: [id number] Patient Account Number: [id number] Date of Birth/Sex: 11/13/1960 (58 y.o. M) Treating RN: Huel Coventry Primary Care Provider: Lucy Chris Other Clinician: Referring Provider: Lucy Chris Treating Provider/Extender: Altamese [place] in Treatment: 3 Subjective History of Present Illness (HPI) ADMISSION 03/17/2019 This is a 58 year old diabetic man who was cutting his lawn of roughly a month ago. A small rock spun out and hit him in the left lower leg. There was immediate superficial injury. He said the pain by that night was quite significant. Saw his primary doctor's office at [facility] clinic in [place]  apparently a culture was done and the patient was prescribed 15 days of doxycycline which he just finished 3 days ago. This is not made any difference. There is a fair amount of drainage still tenderness and aching pain. He has been only covering this with a Band-Aid. 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 X-ray did not show any bone damage. I am going to try to get a result of the culture that his primary doctor did just to ensure that it was negative. He was empirically given doxycycline. He is still complaining of a lot of pain. Using silver alginate 10/21. I was able to confirm with his primary doctor's office through my chart on the patient's phone that the original culture was negative. He only started the cephalexin 3 days ago therefore he has enough until Sunday. I think the erythema and swelling around the wound is better. We are using silver alginate. 10/28; the wound looks somewhat better. There is less swelling and less erythema but it is certainly not gone. The wound itself is about 50% granulated there is still a probing depth from about 6-12 o'clock but it does not go to bone there is still significant tenderness medially. None of the cultures that we have done have been positive either here or in primary doctor's office. I did a single x-ray of this area that was negative. There was no bone damage or foreign object. The original injury happened when he was cutting his grass and a rock projectile hit his leg. He has received courses of doxycycline before he came here and then cephalexin that I gave him even though as mentioned there was never a positive  culture. I thought the cephalexin it resulted in less erythema and less swelling but certainly not resolved it. Objective Constitutional Patient is hypertensive.. Pulse regular and within target range for patient.Marland Kitchen. Respirations regular, non-labored and within target range.. Temperature is normal and within the target range for the patient.Marland Kitchen. appears in no distress. Vitals Time Taken: 9:33 AM, Height: 75 in, Weight: 320 lbs, BMI: 40, Temperature: 98.6 F, Pulse: 42 bpm, Respiratory Rate: 16 breaths/min, Blood Pressure: 158/88 mmHg. Dylan Wright, Dylan D. ([id number]) General Notes: Wound exam; the area in question is a small open wound in the left tibia. This has depth and now probes medially but no bone is palpable. The erythema around the wound I think is somewhat improved. There is still marked tenderness especially medially and not necessarily on the areas medially that is erythematous Integumentary (Hair, Skin) Wound #1 status is Open. Original cause of wound was Trauma. The wound is located on the Left,Anterior Lower Leg. The wound measures 1cm length x 0.6cm width x 0.7cm depth; 0.471cm^2 area and 0.33cm^3 volume. There is Fat Layer (Subcutaneous Tissue) Exposed exposed. There is no tunneling noted, however, there is undermining starting at 7:00 and ending at 10:00 with a maximum distance of 0.6cm. There is a large amount of serous drainage noted. The wound margin is flat and intact. There is medium (34-66%) pink granulation within the wound bed. There is a medium (34-66%) amount of necrotic tissue within the wound bed including Adherent Slough. 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 Anesthetic (add to Medication List): Wound #1 Left,Anterior Lower Leg: Topical Lidocaine 4% cream  applied to wound bed prior to debridement (In Clinic Only). Primary Wound Dressing: Wound #1 Left,Anterior Lower Leg: Silver Collagen - Pack strip lightly into wound Secondary Dressing: Wound #1 Left,Anterior Lower Leg: Boardered Foam Dressing Dressing Change Frequency: Wound #1 Left,Anterior Lower Leg: Change dressing every other day. Follow-up Appointments: Wound #1 Left,Anterior Lower Leg: Return Appointment in 1 week. Edema Control: Wound #1 Left,Anterior Lower Leg: Elevate legs to the level of the heart and pump ankles as often as possible Dylan Wright, Dylan Wright. ([id number]) 1. Still using silver collagen packed lightly of the wound 2. I will repeat the x-ray 3. Refer to infectious disease for consideration of diagnostic testing for sporotrichosis or rapidly growing Mycobacterium given the circumstances of the original wound injury 4. I have considered an MRI ordered this yet Electronic Signature(s) Signed: 04/07/2019 5:54:02 PM By: Baltazar Najjar MD Entered By: Baltazar Najjar on 04/07/2019 16:32:15 Dylan Wright ([id number]) -------------------------------------------------------------------------------- SuperBill Details Patient Name: Dylan Wright, Dylan Wright. Date of Service: 04/07/2019 Medical Record Number: [id number] Patient Account Number: [id number] Date of Birth/Sex: December 20, 1960 (58 y.o. 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 S81.812D Laceration without foreign body, left lower leg, subsequent encounter L03.116 Cellulitis of left lower limb E11.622 Type 2 diabetes  mellitus with other skin ulcer Facility Procedures CPT4 Code: [id number] Description: 99213 - WOUND CARE VISIT-LEV 3 EST PT Modifier: Quantity: 1 Physician Procedures CPT4 Code Description: [id number] 99213 - WC PHYS LEVEL 3 - EST PT ICD-10 Diagnosis Description L97.824 Non-pressure chronic ulcer of other part of left lower leg wi S81.812D Laceration without foreign body, left lower leg, subsequent e L03.116 Cellulitis  of left lower limb Modifier: th necrosis of bon ncounter Quantity: 1 e Electronic Signature(s) Signed: 04/07/2019 5:54:02 PM By: Baltazar Najjar MD Entered By: Baltazar Najjar on 04/07/2019 16:32:37

## 2019-04-08 NOTE — Progress Notes (Signed)
SULEMAN, GUNNING ([id number]) Visit Report for 04/07/2019 Arrival Information Details Patient Name: Dylan Wright, Dylan Wright. Date of Service: 04/07/2019 9:30 AM Medical Record Number: [id number] Patient Account Number: [id number] Date of Birth/Sex: 1960/10/14 (58 y.o. M) Treating RN: Rodell Perna Primary Care Clelia Trabucco: Lucy Chris Other Clinician: Referring Oziah Vitanza: Lucy Chris Treating Getsemani Lindon/Extender: Altamese [place] in Treatment: 3 Visit Information History Since Last Visit Added or deleted any medications: No Patient Arrived: Ambulatory Any new allergies or adverse reactions: No Arrival Time: 09:33 Had a fall or experienced change in No Accompanied By: wife activities of daily living that may affect Transfer Assistance: None risk of falls: Patient Identification Verified: Yes Signs or symptoms of abuse/neglect since last visito No Hospitalized since last visit: No Has Dressing in Place as Prescribed: Yes Pain Present Now: No Electronic Signature(s) Signed: 04/07/2019 11:52:09 AM By: Rodell Perna Entered By: Rodell Perna on 04/07/2019 09:33:35 [name] ([id number]) -------------------------------------------------------------------------------- Clinic Level of Care Assessment Details Patient Name: [name]. Date of Service: 04/07/2019 9:30 AM Medical Record Number: [id number] Patient Account Number: [id number] Date of Birth/Sex: [birth year] (58 y.o. M) Treating RN: Huel Coventry Primary Care Stepehn Eckard: Lucy Chris Other Clinician: Referring Ramey Ketcherside: Lucy Chris Treating Byrl Latin/Extender: Altamese [place] in Treatment: 3 Clinic Level of Care Assessment Items TOOL 4 Quantity Score  - Use when only an EandM is performed on FOLLOW-UP visit 0 ASSESSMENTS - Nursing Assessment / Reassessment X - Reassessment of Co-morbidities (includes updates in patient status) 1 10 X- 1 5 Reassessment of Adherence to Treatment Plan ASSESSMENTS - Wound and  Skin Assessment / Reassessment X - Simple Wound Assessment / Reassessment - one wound 1 5  - 0 Complex Wound Assessment / Reassessment - multiple wounds  - 0 Dermatologic / Skin Assessment (not related to wound area) ASSESSMENTS - Focused Assessment  - Circumferential Edema Measurements - multi extremities 0  - 0 Nutritional Assessment / Counseling / Intervention  - 0 Lower Extremity Assessment (monofilament, tuning fork, pulses)  - 0 Peripheral Arterial Disease Assessment (using hand held doppler) ASSESSMENTS - Ostomy and/or Continence Assessment and Care  - Incontinence Assessment and Management 0  - 0 Ostomy Care Assessment and Management (repouching, etc.) PROCESS - Coordination of Care X - Simple Patient / Family Education for ongoing care 1 15  - 0 Complex (extensive) Patient / Family Education for ongoing care  - 0 Staff obtains Chiropractor, Records, Test Results / Process Orders  - 0 Staff telephones HHA, Nursing Homes / Clarify orders / etc  - 0 Routine Transfer to another Facility (non-emergent condition)  - 0 Routine Hospital Admission (non-emergent condition)  - 0 New Admissions / Manufacturing engineer / Ordering NPWT, Apligraf, etc.  - 0 Emergency Hospital Admission (emergent condition) X- 1 10 Simple Discharge Coordination AERIK, POLAN ([id number])  - 0 Complex (extensive) Discharge Coordination PROCESS - Special Needs  - Pediatric / Minor Patient Management 0  - 0 Isolation Patient Management  - 0 Hearing / Language / Visual special needs  - 0 Assessment of Community assistance (transportation, D/C planning, etc.)  - 0 Additional assistance / Altered mentation  - 0 Support Surface(s) Assessment (bed, cushion, seat, etc.) INTERVENTIONS - Wound Cleansing / Measurement X - Simple Wound Cleansing - one wound 1 5  - 0 Complex Wound Cleansing - multiple wounds X- 1 5 Wound Imaging (photographs - any number  of wounds)  - 0 Wound Tracing (instead of photographs) X- 1 5 Simple Wound Measurement - one wound  -  0 Complex Wound Measurement - multiple wounds INTERVENTIONS - Wound Dressings []  - Small Wound Dressing one or multiple wounds 0 X- 1 15 Medium Wound Dressing one or multiple wounds []  - 0 Large Wound Dressing one or multiple wounds []  - 0 Application of Medications - topical []  - 0 Application of Medications - injection INTERVENTIONS - Miscellaneous []  - External ear exam 0 []  - 0 Specimen Collection (cultures, biopsies, blood, body fluids, etc.) []  - 0 Specimen(s) / Culture(s) sent or taken to Lab for analysis []  - 0 Patient Transfer (multiple staff / Civil Service fast streamer / Similar devices) []  - 0 Simple Staple / Suture removal (25 or less) []  - 0 Complex Staple / Suture removal (26 or more) []  - 0 Hypo / Hyperglycemic Management (close monitor of Blood Glucose) []  - 0 Ankle / Brachial Index (ABI) - do not check if billed separately X- 1 5 Vital Signs KAREY, STUCKI ([id number]) Has the patient been seen at the hospital within the last three years: Yes Total Score: 80 Level Of Care: New/Established - Level 3 Electronic Signature(s) Signed: 04/08/2019 10:04:30 AM By: Gretta Cool, BSN, RN, CWS, Kim RN, BSN Entered By: Gretta Cool, BSN, RN, CWS, Kim on 04/07/2019 10:11:35 [name] ([id number]) -------------------------------------------------------------------------------- Encounter Discharge Information Details Patient Name: AUTHER, LYERLY. Date of Service: 04/07/2019 9:30 AM Medical Record Number: [id number] Patient Account Number: [id number] Date of Birth/Sex: 02-03-1961 (58 y.o. M) Treating RN: Cornell Barman Primary Care Violett Hobbs: Mcneil Sober Other Clinician: Referring Yaslin Kirtley: Mcneil Sober Treating Markeith Jue/Extender: Tito Dine in Treatment: 3 Encounter Discharge Information Items Discharge Condition: Stable Ambulatory Status: Ambulatory Discharge  Destination: Home Transportation: Private Auto Accompanied By: wife Schedule Follow-up Appointment: Yes Clinical Summary of Care: Electronic Signature(s) Signed: 04/08/2019 10:04:30 AM By: Gretta Cool, BSN, RN, CWS, Kim RN, BSN Entered By: Gretta Cool, BSN, RN, CWS, Kim on 04/07/2019 10:17:33 [name] ([id number]) -------------------------------------------------------------------------------- Lower Extremity Assessment Details Patient Name: ANDYN, SALES. Date of Service: 04/07/2019 9:30 AM Medical Record Number: [id number] Patient Account Number: [id number] Date of Birth/Sex: 1960-08-19 (58 y.o. M) Treating RN: Army Melia Primary Care Eleanor Dimichele: Mcneil Sober Other Clinician: Referring Maude Gloor: Mcneil Sober Treating Lucinda Spells/Extender: Ricard Dillon Weeks in Treatment: 3 Vascular Assessment Pulses: Dorsalis Pedis Palpable: [Left:Yes] Posterior Tibial Palpable: [Left:Yes] Electronic Signature(s) Signed: 04/07/2019 11:52:09 AM By: Army Melia Entered By: Army Melia on 04/07/2019 09:38:37 [name] ([id number]) -------------------------------------------------------------------------------- Multi Wound Chart Details Patient Name: [name]. Date of Service: 04/07/2019 9:30 AM Medical Record Number: [id number] Patient Account Number: [id number] Date of Birth/Sex: [birth year] (58 y.o. M) Treating RN: Cornell Barman Primary Care Indi Willhite: Mcneil Sober Other Clinician: Referring Lateesha Bezold: Mcneil Sober Treating Kendell Sagraves/Extender: Tito Dine in Treatment: 3 Vital Signs Height(in): 75 Pulse(bpm): 42 Weight(lbs): 320 Blood Pressure(mmHg): 158/88 Body Mass Index(BMI): 40 Temperature(F): 98.6 Respiratory Rate 16 (breaths/min): Photos: [N/A:N/A] Wound Location: Left Lower Leg - Anterior N/A N/A Wounding Event: Trauma N/A N/A Primary Etiology: Diabetic Wound/Ulcer of the N/A N/A Lower Extremity Comorbid History: Hypertension, Myocardial N/A  N/A Infarction, Colitis, Type II Diabetes Date Acquired: 02/09/2019 N/A N/A Weeks of Treatment: 3 N/A N/A Wound Status: Open N/A N/A Measurements L x W x D 1x0.6x0.7 N/A N/A (cm) Area (cm) : 0.471 N/A N/A Volume (cm) : 0.33 N/A N/A % Reduction in Area: 31.80% N/A N/A % Reduction in Volume: 52.20% N/A N/A Starting Position 1 7 (o'clock): Ending Position 1 10 (o'clock): Maximum Distance 1 (cm): 0.6 Undermining: Yes N/A N/A Classification: Grade 1 N/A N/A  Exudate Amount: Large N/A N/A Exudate Type: Serous N/A N/A Exudate Color: amber N/A N/A Wound Margin: Flat and Intact N/A N/A Granulation Amount: Medium (34-66%) N/A N/A Granulation Quality: Pink N/A N/A TIN, ENGRAM ([id number]) Necrotic Amount: Medium (34-66%) N/A N/A Exposed Structures: Fat Layer (Subcutaneous N/A N/A Tissue) Exposed: Yes Fascia: No Tendon: No Muscle: No Joint: No Bone: No Epithelialization: None N/A N/A Treatment Notes Wound #1 (Left, Anterior Lower Leg) Notes Prisma Ag, packed lightly into wound and BFD Electronic Signature(s) Signed: 04/07/2019 5:54:02 PM By: Baltazar Najjar MD Entered By: Baltazar Najjar on 04/07/2019 16:25:13 [name] ([id number]) -------------------------------------------------------------------------------- Multi-Disciplinary Care Plan Details Patient Name: KARTEL, WOLBERT. Date of Service: 04/07/2019 9:30 AM Medical Record Number: [id number] Patient Account Number: [id number] Date of Birth/Sex: [birth year] (58 y.o. M) Treating RN: Huel Coventry Primary Care Janeka Libman: Lucy Chris Other Clinician: Referring Asanti Craigo: Lucy Chris Treating Joleah Kosak/Extender: Altamese [place] in Treatment: 3 Active Inactive Necrotic Tissue Nursing Diagnoses: Impaired tissue integrity related to necrotic/devitalized tissue Goals: Necrotic/devitalized tissue will be minimized in the wound bed Date Initiated: 03/17/2019 Target Resolution Date: 03/24/2019 Goal Status:  Active Interventions: Assess patient pain level pre-, during and post procedure and prior to discharge Treatment Activities: Apply topical anesthetic as ordered : 03/17/2019 Notes: Orientation to the Wound Care Program Nursing Diagnoses: Knowledge deficit related to the wound healing center program Goals: Patient/caregiver will verbalize understanding of the Wound Healing Center Program Date Initiated: 03/17/2019 Target Resolution Date: 03/24/2019 Goal Status: Active Interventions: Provide education on orientation to the wound center Notes: Wound/Skin Impairment Nursing Diagnoses: Impaired tissue integrity Goals: Ulcer/skin breakdown will have a volume reduction of 30% by week 4 Date Initiated: 03/17/2019 Target Resolution Date: 04/17/2019 Goal Status: Active IMAD, [place] ([id number]) Interventions: Provide education on ulcer and skin care Treatment Activities: Skin care regimen initiated : 03/17/2019 Topical wound management initiated : 03/17/2019 Notes: Electronic Signature(s) Signed: 04/08/2019 10:04:30 AM By: Elliot Gurney, BSN, RN, CWS, Kim RN, BSN Entered By: Elliot Gurney, BSN, RN, CWS, Kim on 04/07/2019 10:09:59 [name] ([id number]) -------------------------------------------------------------------------------- Pain Assessment Details Patient Name: ERRIN, CHEWNING. Date of Service: 04/07/2019 9:30 AM Medical Record Number: [id number] Patient Account Number: [id number] Date of Birth/Sex: [birth year] (58 y.o. M) Treating RN: Rodell Perna Primary Care Anvith Mauriello: Lucy Chris Other Clinician: Referring Silas Muff: Lucy Chris Treating Rickayla Wieland/Extender: Altamese [place] in Treatment: 3 Active Problems Location of Pain Severity and Description of Pain Patient Has Paino No Site Locations Pain Management and Medication Current Pain Management: Electronic Signature(s) Signed: 04/07/2019 11:52:09 AM By: Rodell Perna Entered By: Rodell Perna on 04/07/2019  09:33:51 [name] ([id number]) -------------------------------------------------------------------------------- Patient/Caregiver Education Details Patient Name: MARQUE, RADEMAKER. Date of Service: 04/07/2019 9:30 AM Medical Record Number: [id number] Patient Account Number: [id number] Date of Birth/Gender: August 05, 1960 (58 y.o. M) Treating RN: Huel Coventry Primary Care Physician: Lucy Chris Other Clinician: Referring Physician: Lucy Chris Treating Physician/Extender: Altamese [place] in Treatment: 3 Education Assessment Education Provided To: Patient Education Topics Provided Infection: Handouts: Infection Prevention and Management Methods: Demonstration, Explain/Verbal Responses: State content correctly Venous: Welcome To The Wound Care Center: Wound/Skin Impairment: Handouts: Caring for Your Ulcer Methods: Demonstration, Explain/Verbal Responses: State content correctly Electronic Signature(s) Signed: 04/08/2019 10:04:30 AM By: Elliot Gurney, BSN, RN, CWS, Kim RN, BSN Entered By: Elliot Gurney, BSN, RN, CWS, Kim on 04/07/2019 10:16:46 [name] ([id number]) -------------------------------------------------------------------------------- Wound Assessment Details Patient Name: WESTLEY, BLASS. Date of Service: 04/07/2019 9:30 AM Medical Record Number: [id number] Patient Account Number: [id number] Date of Birth/Sex:  [birth year] (57 y.o. M) Treating RN: Rodell PernaScott, Dajea Primary Care Aneisha Skyles: Lucy ChrisGOERES, LINDSEY Other Clinician: Referring Antha Niday: Lucy ChrisGOERES, LINDSEY Treating Lawanna Cecere/Extender: Altamese CarolinaOBSON, MICHAEL G Weeks in Treatment: 3 Wound Status Wound Number: 1 Primary Diabetic Wound/Ulcer of the Lower Extremity Etiology: Wound Location: Left Lower Leg - Anterior Wound Status: Open Wounding Event: Trauma Comorbid Hypertension, Myocardial Infarction, Colitis, Date Acquired: 02/09/2019 History: Type II Diabetes Weeks Of Treatment: 3 Clustered Wound: No Photos Wound  Measurements Length: (cm) 1 % Reduction Width: (cm) 0.6 % Reduction Depth: (cm) 0.7 Epithelializ Area: (cm) 0.471 Tunneling: Volume: (cm) 0.33 Undermining Starting Ending Po Maximum D in Area: 31.8% in Volume: 52.2% ation: None No : Yes Position (o'clock): 7 sition (o'clock): 10 istance: (cm) 0.6 Wound Description Classification: Grade 1 Foul Odor Af Wound Margin: Flat and Intact Slough/Fibri Exudate Amount: Large Exudate Type: Serous Exudate Color: amber ter Cleansing: No no Yes Wound Bed Granulation Amount: Medium (34-66%) Exposed Structure Granulation Quality: Pink Fascia Exposed: No Necrotic Amount: Medium (34-66%) Fat Layer (Subcutaneous Tissue) Exposed: Yes Necrotic Quality: Adherent Slough Tendon Exposed: No Muscle Exposed: No Joint Exposed: No [name], Brenten D. ([id number]) Bone Exposed: No Treatment Notes Wound #1 (Left, Anterior Lower Leg) Notes Prisma Ag, packed lightly into wound and BFD Electronic Signature(s) Signed: 04/07/2019 11:52:09 AM By: Rodell PernaScott, Dajea Entered By: Rodell PernaScott, Dajea on 04/07/2019 09:38:19 [name], Ralf D. ([id number]) -------------------------------------------------------------------------------- Vitals Details Patient Name: [name], Paydon D. Date of Service: 04/07/2019 9:30 AM Medical Record Number: [id number] Patient Account Number: [id number] Date of Birth/Sex: [birth year] (58 y.o. M) Treating RN: Rodell PernaScott, Dajea Primary Care Per Beagley: Lucy ChrisGOERES, LINDSEY Other Clinician: Referring Alonda Weaber: Lucy ChrisGOERES, LINDSEY Treating Amire Leazer/Extender: Altamese CarolinaOBSON, MICHAEL G Weeks in Treatment: 3 Vital Signs Time Taken: 09:33 Temperature (F): 98.6 Height (in): 75 Pulse (bpm): 42 Weight (lbs): 320 Respiratory Rate (breaths/min): 16 Body Mass Index (BMI): 40 Blood Pressure (mmHg): 158/88 Reference Range: 80 - 120 mg / dl Electronic Signature(s) Signed: 04/07/2019 11:52:09 AM By: Rodell PernaScott, Dajea Entered By: Rodell PernaScott, Dajea on 04/07/2019 09:35:39

## 2019-04-13 ENCOUNTER — Other Ambulatory Visit: Payer: Self-pay

## 2019-04-13 ENCOUNTER — Ambulatory Visit: Payer: Medicare Other | Admitting: Internal Medicine

## 2019-04-13 ENCOUNTER — Encounter: Payer: Self-pay | Admitting: Internal Medicine

## 2019-04-13 DIAGNOSIS — E039 Hypothyroidism, unspecified: Secondary | ICD-10-CM | POA: Insufficient documentation

## 2019-04-13 DIAGNOSIS — K219 Gastro-esophageal reflux disease without esophagitis: Secondary | ICD-10-CM | POA: Diagnosis not present

## 2019-04-13 DIAGNOSIS — I2510 Atherosclerotic heart disease of native coronary artery without angina pectoris: Secondary | ICD-10-CM | POA: Insufficient documentation

## 2019-04-13 DIAGNOSIS — G4733 Obstructive sleep apnea (adult) (pediatric): Secondary | ICD-10-CM

## 2019-04-13 DIAGNOSIS — L97929 Non-pressure chronic ulcer of unspecified part of left lower leg with unspecified severity: Secondary | ICD-10-CM | POA: Insufficient documentation

## 2019-04-13 DIAGNOSIS — N189 Chronic kidney disease, unspecified: Secondary | ICD-10-CM

## 2019-04-13 DIAGNOSIS — M1990 Unspecified osteoarthritis, unspecified site: Secondary | ICD-10-CM

## 2019-04-13 DIAGNOSIS — E119 Type 2 diabetes mellitus without complications: Secondary | ICD-10-CM | POA: Insufficient documentation

## 2019-04-13 DIAGNOSIS — E669 Obesity, unspecified: Secondary | ICD-10-CM | POA: Insufficient documentation

## 2019-04-13 DIAGNOSIS — R251 Tremor, unspecified: Secondary | ICD-10-CM

## 2019-04-13 NOTE — Progress Notes (Signed)
[facility] for Infectious Disease  Reason for Consult: Infected left leg wound Referring Provider: Dr. Dossie Der  Assessment: He seems to be improving slowly after a severe traumatic soft tissue injury to his left shin.  Based on his description and pictures he has in his cell phone and it looks like he did develop a soft tissue abscess that is now improving following incision and drainage and antibiotic therapy.  He believes that cephalexin did help but prefers to stay off of antibiotics for now.  He will follow-up here in 2 weeks  Plan: 1. Continue local wound care 2. Observe off of antibiotics 3. Follow-up here in 2 weeks  Patient Active Problem List   Diagnosis Date Noted  . Leg ulcer, left ([facility]) 04/13/2019    Priority: High  . Diabetes mellitus ([place]) 04/13/2019  . Coronary artery disease 04/13/2019  . GERD (gastroesophageal reflux disease) 04/13/2019  . Hypothyroid 04/13/2019  . CKD (chronic kidney disease) 04/13/2019  . Obstructive sleep apnea 04/13/2019  . Tremor 04/13/2019  . DJD (degenerative joint disease) 04/13/2019  . Obesity 04/13/2019    Patient's Medications  New Prescriptions   No medications on file  Previous Medications   ACCU-CHEK GUIDE TEST STRIP    TEST BLOOD SUGAR TID   AMLODIPINE (NORVASC) 5 MG TABLET       ASPIRIN EC 81 MG TABLET    Take 81 mg by mouth daily.   ATORVASTATIN (LIPITOR) 40 MG TABLET    Take 40 mg by mouth daily.   BLOOD GLUCOSE MONITORING SUPPL (FIFTY50 GLUCOSE METER 2.0) W/DEVICE KIT    Use as directed   BUSPIRONE (BUSPAR) 5 MG TABLET    Take 5 mg by mouth 2 (two) times daily.   CANDESARTAN (ATACAND) 32 MG TABLET    Take 32 mg by mouth daily.   CHOLECALCIFEROL (VITAMIN D) 400 UNITS TABS TABLET    Take 2,000 Units by mouth daily.   CHOLESTYRAMINE LIGHT (PREVALITE) 4 G PACKET    Take 4 g by mouth 2 (two) times daily.   CLONIDINE (CATAPRES) 0.1 MG TABLET    Take 0.1 mg by mouth 2 (two) times daily.   ESCITALOPRAM  (LEXAPRO) 20 MG TABLET    Take 20 mg by mouth daily.   GABAPENTIN (NEURONTIN) 400 MG CAPSULE    Take 800 mg by mouth at bedtime.   GLIPIZIDE (GLUCOTROL XL) 10 MG 24 HR TABLET    Take 10 mg by mouth daily with breakfast.   HUMULIN 70/30 (70-30) 100 UNIT/ML INJECTION    ADM 110 UNI [place] BID B MEALS   INSULIN SYRINGE-NEEDLE U-100 31G X 5/16" 1 ML MISC    by Does not apply route as directed.   LEVOTHYROXINE (SYNTHROID, LEVOTHROID) 175 MCG TABLET    Take 175 mcg by mouth daily before breakfast.   MAGNESIUM OXIDE (MAG-OX) 400 MG TABLET    Take 400 mg by mouth daily.   METFORMIN (GLUCOPHAGE) 1000 MG TABLET    Take 1,000 mg by mouth 2 (two) times daily with a meal.   MULTIPLE VITAMIN (MULTIVITAMIN) TABLET    Take 1 tablet by mouth daily.   OMEPRAZOLE (PRILOSEC) 40 MG CAPSULE    Take 40 mg by mouth daily.   TRIAMTERENE-HYDROCHLOROTHIAZIDE (DYAZIDE) 37.5-25 MG CAPSULE    Take 1 capsule by mouth daily.  Modified Medications   No medications on file  Discontinued Medications   CYCLOBENZAPRINE (FLEXERIL) 10 MG TABLET    Take 10 mg  by mouth at bedtime.   INSULIN LISPRO (HUMALOG) 100 UNIT/ML INJECTION    Inject 22 Units into the skin 3 (three) times daily with meals.   INSULIN NPH HUMAN (HUMULIN N,NOVOLIN N) 100 UNIT/ML INJECTION    Inject 80 Units into the skin 2 (two) times daily before a meal.    HPI: Dylan Wright is a 58 y.o. male he was struck on the left shin by a rock while mowing the lawn on 02/26/2019.  He was wearing shorts at the time and the rock hit bare skin causing a break in the skin with some bleeding.  It was severely painful.  Over the next week he had a dramatic increase in pain, swelling and redness.  He saw his primary care doctor who put him on doxycycline.  The area kept getting larger and he underwent I&D on 03/05/2019.  Purulence was noted.  Gram stain and cultures were negative.  He received a second week long course of doxycycline without obvious improvement and was referred to the wound  center.  He has noted gradual improvement over the last month.  He was treated with a 7-day course of cephalexin starting 03/28/2019 and is recently switched to a topical collagen application.  Review of Systems: Review of Systems  HENT:       Recent mouth sores that he attributes to antibiotics are slowly improving since he stopped cephalexin.  Gastrointestinal: Negative for abdominal pain, diarrhea, nausea and vomiting.      Past Medical History:  Diagnosis Date  . Abnormality of gait and mobility   . Anemia   . Anxiety   . Arthritis    OSTEOARTHRITIS OF SPINE-CERVICAL REGION  . Bile acid malabsorption syndrome   . Depression   . Diabetes mellitus without complication ([place])   . Dyskinesia   . GERD (gastroesophageal reflux disease)   . Hyperlipidemia   . Hypertension   . Memory loss   . Obesity   . Posterior tibial tendon dysfunction (PTTD) of right lower extremity   . Radiculopathy, lumbosacral region   . RBBB   . Sleep apnea   . Subfibular impingement of right lower extremity   . Thyroid disease   . Tremor   . Vitamin D deficiency     Social History   Tobacco Use  . Smoking status: Former Smoker    Types: Cigarettes    Quit date: 12/08/1984    Years since quitting: 34.3  . Smokeless tobacco: Current User    Types: Snuff  Substance Use Topics  . Alcohol use: Yes    Comment: OCC  . Drug use: Never    No family history on file. Allergies  Allergen Reactions  . Propoxyphene Other (See Comments)    Other reaction(s): Other (See Comments) Altered mental status Becomes irritated. Combative and agittied   . Victoza [Liraglutide] Nausea Only  . Ciprofloxacin Anxiety and Palpitations    Other reaction(s): Other (See Comments)    OBJECTIVE: Vitals:   04/13/19 1414  BP: (!) 157/95  Pulse: (!) 47  Temp: 97.9 F (36.6 C)  TempSrc: Oral  Weight: (!) 322 lb 3.2 oz (146.1 kg)  Height: _0  (1.905 m)   Body mass index is 40.27 kg/m.   Physical Exam  Constitutional:      Comments: He is accompanied by his wife.  Musculoskeletal:     Comments: He has a small open wound over his left lower shin with surrounding, nonblanching erythema.  There was a small spot  of serous drainage on his Band-Aid dressing.  There is no fluctuance.       Microbiology: No results found for this or any previous visit (from the past 240 hour(s)).  Michel Bickers, MD [facility] [phone] pager   [phone] cell 04/13/2019, 2:29 PM

## 2019-04-14 ENCOUNTER — Encounter: Payer: Medicare Other | Attending: Internal Medicine | Admitting: Internal Medicine

## 2019-04-14 DIAGNOSIS — L03116 Cellulitis of left lower limb: Secondary | ICD-10-CM | POA: Insufficient documentation

## 2019-04-14 DIAGNOSIS — S81812D Laceration without foreign body, left lower leg, subsequent encounter: Secondary | ICD-10-CM | POA: Insufficient documentation

## 2019-04-14 DIAGNOSIS — I129 Hypertensive chronic kidney disease with stage 1 through stage 4 chronic kidney disease, or unspecified chronic kidney disease: Secondary | ICD-10-CM | POA: Diagnosis not present

## 2019-04-14 DIAGNOSIS — I2510 Atherosclerotic heart disease of native coronary artery without angina pectoris: Secondary | ICD-10-CM | POA: Diagnosis not present

## 2019-04-14 DIAGNOSIS — E11622 Type 2 diabetes mellitus with other skin ulcer: Secondary | ICD-10-CM | POA: Diagnosis not present

## 2019-04-14 DIAGNOSIS — N183 Chronic kidney disease, stage 3 unspecified: Secondary | ICD-10-CM | POA: Diagnosis not present

## 2019-04-14 DIAGNOSIS — E039 Hypothyroidism, unspecified: Secondary | ICD-10-CM | POA: Insufficient documentation

## 2019-04-14 DIAGNOSIS — I252 Old myocardial infarction: Secondary | ICD-10-CM | POA: Insufficient documentation

## 2019-04-14 DIAGNOSIS — L97824 Non-pressure chronic ulcer of other part of left lower leg with necrosis of bone: Secondary | ICD-10-CM | POA: Diagnosis not present

## 2019-04-14 DIAGNOSIS — L97829 Non-pressure chronic ulcer of other part of left lower leg with unspecified severity: Secondary | ICD-10-CM | POA: Diagnosis present

## 2019-04-14 DIAGNOSIS — E1122 Type 2 diabetes mellitus with diabetic chronic kidney disease: Secondary | ICD-10-CM | POA: Diagnosis not present

## 2019-04-16 NOTE — Progress Notes (Signed)
Dylan Wright, Chinedum D. ([id number]) Visit Report for 04/14/2019 Arrival Information Details Patient Name: Dylan Wright, Riggs D. Date of Service: 04/14/2019 8:15 AM Medical Record Number: [id number] Patient Account Number: [id number] Date of Birth/Sex: 15-May-1961 (58 y.o. M) Treating RN: Arnette NorrisBiell, Kristina Primary Care Jamesyn Lindell: Lucy ChrisGOERES, LINDSEY Other Clinician: Referring Kasara Schomer: Lucy ChrisGOERES, LINDSEY Treating Brandyce Dimario/Extender: Altamese CarolinaOBSON, MICHAEL G Weeks in Treatment: 4 Visit Information History Since Last Visit Added or deleted any medications: No Patient Arrived: Cane Any new allergies or adverse reactions: No Arrival Time: 08:26 Had a fall or experienced change in No Accompanied By: wife activities of daily living that may affect Transfer Assistance: None risk of falls: Patient Identification Verified: Yes Signs or symptoms of abuse/neglect since last visito No Secondary Verification Process Completed: Yes Hospitalized since last visit: No Has Dressing in Place as Prescribed: Yes Pain Present Now: No Electronic Signature(s) Signed: 04/15/2019 4:15:06 PM By: Arnette NorrisBiell, Kristina Entered By: Arnette NorrisBiell, Kristina on 04/14/2019 08:27:13 Dylan Wright, Marcos D. ([id number]) -------------------------------------------------------------------------------- Encounter Discharge Information Details Patient Name: Dylan Wright, Per D. Date of Service: 04/14/2019 8:15 AM Medical Record Number: [id number] Patient Account Number: [id number] Date of Birth/Sex: 15-May-1961 (58 y.o. M) Treating RN: Huel CoventryWoody, Kim Primary Care Coury Grieger: Lucy ChrisGOERES, LINDSEY Other Clinician: Referring Jeslie Lowe: Lucy ChrisGOERES, LINDSEY Treating Bronc Brosseau/Extender: Altamese CarolinaOBSON, MICHAEL G Weeks in Treatment: 4 Encounter Discharge Information Items Post Procedure Vitals Discharge Condition: Stable Temperature (F): 98.5 Ambulatory Status: Cane Pulse (bpm): 47 Discharge Destination: Home Respiratory Rate (breaths/min): 18 Transportation: Private Auto Blood Pressure (mmHg):  172/93 Accompanied By: spouse Schedule Follow-up Appointment: Yes Clinical Summary of Care: Electronic Signature(s) Signed: 04/15/2019 5:19:38 PM By: Elliot GurneyWoody, BSN, RN, CWS, Kim RN, BSN Entered By: Elliot GurneyWoody, BSN, RN, CWS, Kim on 04/14/2019 08:57:56 Dylan Wright, Samuel D. ([id number]) -------------------------------------------------------------------------------- Lower Extremity Assessment Details Patient Name: Dylan Wright, Melville D. Date of Service: 04/14/2019 8:15 AM Medical Record Number: [id number] Patient Account Number: [id number] Date of Birth/Sex: 15-May-1961 (58 y.o. M) Treating RN: Arnette NorrisBiell, Kristina Primary Care Cherise Fedder: Lucy ChrisGOERES, LINDSEY Other Clinician: Referring Danecia Underdown: Lucy ChrisGOERES, LINDSEY Treating Tieasha Larsen/Extender: Maxwell CaulOBSON, MICHAEL G Weeks in Treatment: 4 Vascular Assessment Pulses: Dorsalis Pedis Palpable: [Left:Yes] Posterior Tibial Palpable: [Left:Yes] Electronic Signature(s) Signed: 04/15/2019 4:15:06 PM By: Arnette NorrisBiell, Kristina Entered By: Arnette NorrisBiell, Kristina on 04/14/2019 08:35:13 Dylan Wright, Williamson D. ([id number]) -------------------------------------------------------------------------------- Multi Wound Chart Details Patient Name: Dylan Wright, Jhalil D. Date of Service: 04/14/2019 8:15 AM Medical Record Number: [id number] Patient Account Number: [id number] Date of Birth/Sex: 15-May-1961 (58 y.o. M) Treating RN: Huel CoventryWoody, Kim Primary Care Zayveon Raschke: Lucy ChrisGOERES, LINDSEY Other Clinician: Referring Karalina Tift: Lucy ChrisGOERES, LINDSEY Treating Garreth Burnsworth/Extender: Altamese CarolinaOBSON, MICHAEL G Weeks in Treatment: 4 Vital Signs Height(in): 75 Pulse(bpm): 47 Weight(lbs): 320 Blood Pressure(mmHg): 172/93 Body Mass Index(BMI): 40 Temperature(F): 98.5 Respiratory Rate 18 (breaths/min): Photos: [N/A:N/A] Wound Location: Left Lower Leg - Anterior N/A N/A Wounding Event: Trauma N/A N/A Primary Etiology: Diabetic Wound/Ulcer of the N/A N/A Lower Extremity Comorbid History: Hypertension, Myocardial N/A N/A Infarction, Colitis, Type  II Diabetes Date Acquired: 02/09/2019 N/A N/A Weeks of Treatment: 4 N/A N/A Wound Status: Open N/A N/A Measurements L x W x D 0.7x0.3x0.4 N/A N/A (cm) Area (cm) : 0.165 N/A N/A Volume (cm) : 0.066 N/A N/A % Reduction in Area: 76.10% N/A N/A % Reduction in Volume: 90.40% N/A N/A Classification: Grade 1 N/A N/A Exudate Amount: Medium N/A N/A Exudate Type: Serous N/A N/A Exudate Color: amber N/A N/A Wound Margin: Flat and Intact N/A N/A Granulation Amount: Medium (34-66%) N/A N/A Granulation Quality: Pink N/A N/A Necrotic Amount: Medium (34-66%) N/A N/A Exposed Structures: Fat Layer (Subcutaneous N/A N/A Tissue) Exposed: Yes Fascia: No  Tendon: No Muscle: No HOSEY, BURMESTER ([id number]) Joint: No Bone: No Epithelialization: None N/A N/A Debridement: Debridement - Excisional N/A N/A Pre-procedure 08:49 N/A N/A Verification/Time Out Taken: Pain Control: Lidocaine N/A N/A Tissue Debrided: Subcutaneous, Slough N/A N/A Level: Skin/Subcutaneous Tissue N/A N/A Debridement Area (sq cm): 0.21 N/A N/A Instrument: Curette N/A N/A Bleeding: Minimum N/A N/A Hemostasis Achieved: Pressure N/A N/A Debridement Treatment Procedure was tolerated well N/A N/A Response: Post Debridement 0.7x0.3x0.4 N/A N/A Measurements L x W x D (cm) Post Debridement Volume: 0.066 N/A N/A (cm) Procedures Performed: Debridement N/A N/A Treatment Notes Wound #1 (Left, Anterior Lower Leg) Notes Prisma Ag, packed lightly into wound and BFD Electronic Signature(s) Signed: 04/16/2019 7:47:21 AM By: Baltazar Najjar MD Entered By: Baltazar Najjar on 04/14/2019 09:45:37 Dylan Galas ([id number]) -------------------------------------------------------------------------------- Multi-Disciplinary Care Plan Details Patient Name: CASS, VANDERMEULEN. Date of Service: 04/14/2019 8:15 AM Medical Record Number: [id number] Patient Account Number: [id number] Date of Birth/Sex: 09-19-1960 (58 y.o. M) Treating RN: Huel Coventry Primary Care Faige Seely: Lucy Chris Other Clinician: Referring Shaunice Levitan: Lucy Chris Treating Lilyrose Tanney/Extender: Altamese Sperry in Treatment: 4 Active Inactive Necrotic Tissue Nursing Diagnoses: Impaired tissue integrity related to necrotic/devitalized tissue Goals: Necrotic/devitalized tissue will be minimized in the wound bed Date Initiated: 03/17/2019 Target Resolution Date: 03/24/2019 Goal Status: Active Interventions: Assess patient pain level pre-, during and post procedure and prior to discharge Treatment Activities: Apply topical anesthetic as ordered : 03/17/2019 Notes: Orientation to the Wound Care Program Nursing Diagnoses: Knowledge deficit related to the wound healing center program Goals: Patient/caregiver will verbalize understanding of the Wound Healing Center Program Date Initiated: 03/17/2019 Target Resolution Date: 03/24/2019 Goal Status: Active Interventions: Provide education on orientation to the wound center Notes: Wound/Skin Impairment Nursing Diagnoses: Impaired tissue integrity Goals: Ulcer/skin breakdown will have a volume reduction of 30% by week 4 Date Initiated: 03/17/2019 Target Resolution Date: 04/17/2019 Goal Status: Active DENZAL, MEIR ([id number]) Interventions: Provide education on ulcer and skin care Treatment Activities: Skin care regimen initiated : 03/17/2019 Topical wound management initiated : 03/17/2019 Notes: Electronic Signature(s) Signed: 04/15/2019 5:19:38 PM By: Elliot Gurney, BSN, RN, CWS, Kim RN, BSN Entered By: Elliot Gurney, BSN, RN, CWS, Kim on 04/14/2019 08:52:00 ROMAINE, NEVILLE ([id number]) -------------------------------------------------------------------------------- Pain Assessment Details Patient Name: LEYLAND, KENNA. Date of Service: 04/14/2019 8:15 AM Medical Record Number: [id number] Patient Account Number: [id number] Date of Birth/Sex: 1961/04/22 (58 y.o. M) Treating RN: Arnette Norris Primary Care  Briyonna Omara: Lucy Chris Other Clinician: Referring Carmello Cabiness: Lucy Chris Treating Jahziah Simonin/Extender: Altamese Towner in Treatment: 4 Active Problems Location of Pain Severity and Description of Pain Patient Has Paino No Site Locations Pain Management and Medication Current Pain Management: Electronic Signature(s) Signed: 04/15/2019 4:15:06 PM By: Arnette Norris Entered By: Arnette Norris on 04/14/2019 08:28:16 Dylan Galas ([id number]) -------------------------------------------------------------------------------- Patient/Caregiver Education Details Patient Name: YECHIEL, ERNY. Date of Service: 04/14/2019 8:15 AM Medical Record Number: [id number] Patient Account Number: [id number] Date of Birth/Gender: 1960/08/29 (58 y.o. M) Treating RN: Huel Coventry Primary Care Physician: Lucy Chris Other Clinician: Referring Physician: Lucy Chris Treating Physician/Extender: Altamese [place] in Treatment: 4 Education Assessment Education Provided To: Patient Education Topics Provided Wound/Skin Impairment: Handouts: Caring for Your Ulcer Methods: Demonstration, Explain/Verbal Responses: State content correctly Electronic Signature(s) Signed: 04/15/2019 5:19:38 PM By: Elliot Gurney, BSN, RN, CWS, Kim RN, BSN Entered By: Elliot Gurney, BSN, RN, CWS, Kim on 04/14/2019 08:56:45 Dylan Galas ([id number]) -------------------------------------------------------------------------------- Wound Assessment Details Patient Name: JOAL, EAKLE. Date of Service: 04/14/2019 8:15 AM  Medical Record Number: [id number] Patient Account Number: [id number] Date of Birth/Sex: Sep 11, 1960 (58 y.o. M) Treating RN: Harold Barban Primary Care Janaria Mccammon: Mcneil Sober Other Clinician: Referring Macky Galik: Mcneil Sober Treating Ronal Maybury/Extender: Tito Dine in Treatment: 4 Wound Status Wound Number: 1 Primary Diabetic Wound/Ulcer of the Lower Extremity Etiology: Wound Location:  Left Lower Leg - Anterior Wound Status: Open Wounding Event: Trauma Comorbid Hypertension, Myocardial Infarction, Colitis, Date Acquired: 02/09/2019 History: Type II Diabetes Weeks Of Treatment: 4 Clustered Wound: No Photos Wound Measurements Length: (cm) 0.7 % Reduction Width: (cm) 0.3 % Reduction Depth: (cm) 0.4 Epithelializ Area: (cm) 0.165 Tunneling: Volume: (cm) 0.066 Undermining in Area: 76.1% in Volume: 90.4% ation: None No : No Wound Description Classification: Grade 1 Foul Odor Af Wound Margin: Flat and Intact Slough/Fibri Exudate Amount: Medium Exudate Type: Serous Exudate Color: amber ter Cleansing: No no Yes Wound Bed Granulation Amount: Medium (34-66%) Exposed Structure Granulation Quality: Pink Fascia Exposed: No Necrotic Amount: Medium (34-66%) Fat Layer (Subcutaneous Tissue) Exposed: Yes Necrotic Quality: Adherent Slough Tendon Exposed: No Muscle Exposed: No Joint Exposed: No Bone Exposed: No Treatment Notes ADELARD, SANON ([id number]) Wound #1 (Left, Anterior Lower Leg) Notes Prisma Ag, packed lightly into wound and BFD Electronic Signature(s) Signed: 04/15/2019 4:15:06 PM By: Harold Barban Entered By: Harold Barban on 04/14/2019 08:34:53 [name] ([id number]) -------------------------------------------------------------------------------- Vitals Details Patient Name: [name]. Date of Service: 04/14/2019 8:15 AM Medical Record Number: [id number] Patient Account Number: [id number] Date of Birth/Sex: 1961/01/13 (58 y.o. M) Treating RN: Harold Barban Primary Care Geovani Tootle: Mcneil Sober Other Clinician: Referring Logen Heintzelman: Mcneil Sober Treating Alexica Schlossberg/Extender: Tito Dine in Treatment: 4 Vital Signs Time Taken: 08:25 Temperature (F): 98.5 Height (in): 75 Pulse (bpm): 47 Weight (lbs): 320 Respiratory Rate (breaths/min): 18 Body Mass Index (BMI): 40 Blood Pressure (mmHg): 172/93 Reference Range: 80 -  120 mg / dl Electronic Signature(s) Signed: 04/15/2019 4:15:06 PM By: Harold Barban Entered By: Harold Barban on 04/14/2019 08:30:30

## 2019-04-16 NOTE — Progress Notes (Signed)
Dylan Wright ([id number]) Visit Report for 04/14/2019 Debridement Details Patient Name: Dylan Wright, Dylan Wright. Date of Service: 04/14/2019 8:15 AM Medical Record Number: [id number] Patient Account Number: [id number] Date of Birth/Sex: [birth year] (58 y.o. M) Treating RN: Huel Coventry Primary Care Provider: Lucy Chris Other Clinician: Referring Provider: Lucy Chris Treating Provider/Extender: Altamese [place] in Treatment: 4 Debridement Performed for Wound #1 Left,Anterior Lower Leg Assessment: Performed By: Physician Maxwell Caul, MD Debridement Type: Debridement Severity of Tissue Pre Fat layer exposed Debridement: Level of Consciousness (Pre- Awake and Alert procedure): Pre-procedure Verification/Time Yes - 08:49 Out Taken: Start Time: 08:49 Pain Control: Lidocaine Total Area Debrided (L x W): 0.7 (cm) x 0.3 (cm) = 0.21 (cm) Tissue and other material Viable, Non-Viable, Slough, Subcutaneous, Slough debrided: Level: Skin/Subcutaneous Tissue Debridement Description: Excisional Instrument: Curette Bleeding: Minimum Hemostasis Achieved: Pressure End Time: 08:53 Response to Treatment: Procedure was tolerated well Level of Consciousness Awake and Alert (Post-procedure): Post Debridement Measurements of Total Wound Length: (cm) 0.7 Width: (cm) 0.3 Depth: (cm) 0.4 Volume: (cm) 0.066 Character of Wound/Ulcer Post Debridement: Stable Severity of Tissue Post Debridement: Fat layer exposed Post Procedure Diagnosis Same as Pre-procedure Electronic Signature(s) Signed: 04/15/2019 5:19:38 PM By: Elliot Gurney, BSN, RN, CWS, Kim RN, BSN Signed: 04/16/2019 7:47:21 AM By: Baltazar Najjar MD Entered By: Baltazar Najjar on 04/14/2019 09:45:50 Dylan Wright ([id number]) -------------------------------------------------------------------------------- HPI Details Patient Name: Dylan Wright, Dylan Wright. Date of Service: 04/14/2019 8:15 AM Medical Record Number: [id number] Patient Account  Number: [id number] Date of Birth/Sex: Feb 22, 1961 (58 y.o. M) Treating RN: Huel Coventry Primary Care Provider: Lucy Chris Other Clinician: Referring Provider: Lucy Chris Treating Provider/Extender: Altamese [place] in Treatment: 4 History of Present Illness HPI Description: ADMISSION 03/17/2019 This is a 58 year old diabetic man who was cutting his lawn of roughly a month ago. A small rock spun out and hit him in the left lower leg. There was immediate superficial injury. He said the pain by that night was quite significant. Saw his primary doctor's office at [facility] clinic in [place] apparently a culture was done and the patient was prescribed 15 days of doxycycline which he just finished 3 days ago. This is not made any difference. There is a fair amount of drainage still tenderness and aching pain. He has been only covering this with a Band-Aid. 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 X-ray did not show any bone damage. I am going to try to get a result of the culture that his primary doctor did just to ensure that it was negative. He was empirically given doxycycline. He is still complaining of a lot of pain. Using silver alginate 10/21. I was able to confirm with his primary doctor's office through my chart on the patient's phone that the original culture was negative. He only started the cephalexin 3 days ago therefore he has enough until Sunday. I think the erythema and swelling around the wound is better. We are using silver alginate. 10/28; the wound looks somewhat better. There is less swelling and less erythema but it is certainly not gone. The  wound itself is about 50% granulated there is still a probing depth from about 6-12 o'clock but it does not go to bone there is  still significant tenderness medially. None of the cultures that we have done have been positive either here or in primary doctor's office. I did a single x-ray of this area that was negative. There was no bone damage or foreign object. The original injury happened when he was cutting his grass and a rock projectile hit his leg. He has received courses of doxycycline before he came here and then cephalexin that I gave him even though as mentioned there was never a positive culture. I thought the cephalexin it resulted in less erythema and less swelling but certainly not resolved it. 11/4; patient states the wound is a lot better and a lot less painful. He saw Dr. Megan Salon who did not specifically address my concerns i.e. sporotrichosis or rapidly growing mycobacteria however I think you talk to the patient in general terms about this. He did not feel any antibiotics were denied and he did not do the specific tests largely because the patient's wound seems to be getting better. Electronic Signature(s) Signed: 04/16/2019 7:47:21 AM By: Linton Ham MD Entered By: Linton Ham on 04/14/2019 09:46:35 [name] ([id number]) -------------------------------------------------------------------------------- Physical Exam Details Patient Name: Dylan Wright, Dylan Wright. Date of Service: 04/14/2019 8:15 AM Medical Record Number: [id number] Patient Account Number: [id number] Date of Birth/Sex: 04/18/1961 (58 y.o. M) Treating RN: Cornell Barman Primary Care Provider: Mcneil Sober Other Clinician: Referring Provider: Mcneil Sober Treating Provider/Extender: Tito Dine in Treatment: 4 Constitutional Patient is hypertensive.. Pulse regular and within target range for patient.Marland Kitchen Respirations regular, non-labored and within target range.. Temperature is normal and within  the target range for the patient.Marland Kitchen appears in no distress. Notes Wound exam; much better looking surrounding erythema and not nearly as tender. There is no probing depth this time. Necrotic debris removed with a #3 curette to a healthy surface. Electronic Signature(s) Signed: 04/16/2019 7:47:21 AM By: Linton Ham MD Entered By: Linton Ham on 04/14/2019 09:47:13 [name] ([id number]) -------------------------------------------------------------------------------- Physician Orders Details Patient Name: Dylan Wright, Dylan Wright. Date of Service: 04/14/2019 8:15 AM Medical Record Number: [id number] Patient Account Number: [id number] Date of Birth/Sex: [birth year] (58 y.o. M) Treating RN: Cornell Barman Primary Care Provider: Mcneil Sober Other Clinician: Referring Provider: Mcneil Sober Treating Provider/Extender: Tito Dine in Treatment: 4 Verbal / Phone Orders: No Diagnosis Coding Wound Cleansing Wound #1 Left,Anterior Lower Leg o May Shower, gently pat wound dry prior to applying new dressing. Anesthetic (add to Medication List) Wound #1 Left,Anterior Lower Leg o Topical Lidocaine 4% cream applied to wound bed prior to debridement (In Clinic Only). Primary Wound Dressing Wound #1 Left,Anterior Lower Leg o Silver Collagen - Pack strip lightly into wound Secondary Dressing Wound #1 Left,Anterior Lower Leg o Boardered Foam Dressing Dressing Change Frequency Wound #1 Left,Anterior Lower Leg o Change dressing every other day. Follow-up Appointments Wound #1 Left,Anterior Lower Leg o Return Appointment in 1 week. Edema Control Wound #1 Left,Anterior Lower Leg o Elevate legs to the level of the heart and pump ankles as often as possible Electronic Signature(s) Signed: 04/15/2019 5:19:38 PM By: Gretta Cool, BSN, RN, CWS, Kim RN, BSN Signed: 04/16/2019 7:47:21 AM By: Linton Ham MD Entered By: Gretta Cool, BSN, RN, CWS, Kim on 04/14/2019 08:53:55 Dylan Wright, Dylan Wright  ([id number]) -------------------------------------------------------------------------------- Problem List Details Patient Name: Dylan Wright, Dylan Wright. Date of Service: 04/14/2019 8:15 AM Medical Record Number: [id number] Patient Account Number: [id number] Date of Birth/Sex: 03-Dec-1960 (58 y.o. 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 Date of Service: 04/14/2019 8:15 AM Medical Record Number: [id number] Patient Account Number: [id number] Date of Birth/Sex: 06/29/1960 (58 y.o. M) Treating RN: Huel Coventry Primary Care Provider: Lucy Chris Other Clinician: Referring Provider: Lucy Chris Treating Provider/Extender: Altamese [place] in Treatment: 4 Subjective History of Present Illness (HPI) ADMISSION 03/17/2019 This is a 58 year old diabetic man who was cutting his lawn of roughly a month ago. A small rock spun out and hit him in the left lower leg. There was immediate superficial injury. He said the pain by that night was quite significant. Saw his primary doctor's office at [facility] clinic in [place]  apparently a culture was done and the patient was prescribed 15 days of doxycycline which he just finished 3 days ago. This is not made any difference. There is a fair amount of drainage still tenderness and aching pain. He has been only covering this with a Band-Aid. 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 X-ray did not show any bone damage. I am going to try to get a result of the culture that his primary doctor did just to ensure that it was negative. He was empirically given doxycycline. He is still complaining of a lot of pain. Using silver alginate 10/21. I was able to confirm with his primary doctor's office through my chart on the patient's phone that the original culture was negative. He only started the cephalexin 3 days ago therefore he has enough until Sunday. I think the erythema and swelling around the wound is better. We are using silver alginate. 10/28; the wound looks somewhat better. There is less swelling and less erythema but it is certainly not gone. The wound itself is about 50% granulated there is still a probing depth from about 6-12 o'clock but it does not go to bone there is still significant tenderness medially. None of the cultures that we have done have been positive either here or in primary doctor's office. I did a single x-ray of this area that was negative. There was no bone damage or foreign object. The original injury happened when he was cutting his grass and a rock projectile hit his leg. He has received courses of doxycycline before he came here and then cephalexin that I gave him even though as mentioned there was never a positive  culture. I thought the cephalexin it resulted in less erythema and less swelling but certainly not resolved it. 11/4; patient states the wound is a lot better and a lot less painful. He saw Dr. Orvan Falconer who did not specifically address my concerns i.e. sporotrichosis or rapidly growing mycobacteria however I think  you talk to the patient in general terms about this. He did not feel any antibiotics were denied and he did not do the specific tests largely because the patient's wound seems to be getting better. Objective Constitutional Patient is hypertensive.. Pulse regular and within target range for patient.Marland Kitchen Respirations regular, non-labored and within target range.. Temperature is normal and within the target range for the patient.Marland Kitchen appears in no distress. Dylan Wright, Dylan Wright ([id number]) Vitals Time Taken: 8:25 AM, Height: 75 in, Weight: 320 lbs, BMI: 40, Temperature: 98.5 F, Pulse: 47 bpm, Respiratory Rate: 18 breaths/min, Blood Pressure: 172/93 mmHg. General Notes: Wound exam; much better looking surrounding erythema and not nearly as tender. There is no probing depth this time. Necrotic debris removed with a #3 curette to a healthy surface. Integumentary (Hair, Skin) Wound #1 status is Open. Original cause of wound was Trauma. The wound is located on the Left,Anterior Lower Leg. The wound measures 0.7cm length x 0.3cm width x 0.4cm depth; 0.165cm^2 area and 0.066cm^3 volume. There is Fat Layer (Subcutaneous Tissue) Exposed exposed. There is no tunneling or undermining noted. There is a medium amount of serous drainage noted. The wound margin is flat and intact. There is medium (34-66%) pink granulation within the wound bed. There is a medium (34-66%) amount of necrotic tissue within the wound bed including Adherent Slough. 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 There was a Excisional Skin/Subcutaneous Tissue Debridement with a total area of 0.21 sq cm performed by Maxwell Caul, MD. With the following instrument(s): Curette to remove Viable and Non-Viable tissue/material. Material removed includes Subcutaneous Tissue and Slough and after achieving pain control using Lidocaine. No specimens were taken. A time out was conducted at 08:49, prior to the start of the procedure. A Minimum amount of bleeding was controlled with Pressure. The procedure was tolerated well. Post Debridement Measurements: 0.7cm length x 0.3cm width x 0.4cm depth; 0.066cm^3 volume. Character of Wound/Ulcer Post Debridement is stable. Severity of Tissue Post Debridement is: Fat layer exposed. Post procedure Diagnosis Wound #1: Same as Pre-Procedure Plan Wound Cleansing: Wound #1 Left,Anterior Lower Leg: May Shower, gently pat wound dry prior to applying new dressing. Anesthetic (add to Medication List): Dylan Wright, Dylan Wright ([id number]) Wound #1 Left,Anterior Lower Leg: Topical Lidocaine 4% cream applied to wound bed prior to debridement (In Clinic Only). Primary Wound Dressing: Wound #1 Left,Anterior Lower Leg: Silver Collagen - Pack strip lightly into wound Secondary Dressing: Wound #1 Left,Anterior Lower Leg: Boardered Foam Dressing Dressing Change Frequency: Wound #1 Left,Anterior Lower Leg: Change dressing every other day. Follow-up Appointments: Wound #1 Left,Anterior Lower Leg: Return Appointment in 1 week. Edema Control: Wound #1 Left,Anterior Lower Leg: Elevate legs to the level of the heart and pump ankles as often as possible #1 we continued with silver collagen  as the primary dressing. 2. Post debridement the wound surface looks better. No cultures were done no additional antibiotics Electronic Signature(s) Signed: 04/16/2019 7:47:21 AM By: Baltazar Najjar MD Entered By: Baltazar Najjar on 04/14/2019 09:48:57 Dylan Wright ([id number]) -------------------------------------------------------------------------------- SuperBill Details Patient Name: Dylan Wright, Dylan Wright. Date of Service: 04/14/2019 Medical Record Number: [id number]  Patient Account Number: [id number] Date of Birth/Sex: 1961/02/19 [age] y.o. 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 S81.812D Laceration without foreign body, left lower leg, subsequent encounter L03.116 Cellulitis of left lower limb E11.622 Type 2 diabetes mellitus with other skin ulcer Facility Procedures CPT4 Code Description: [id number] 11042 - DEB SUBQ TISSUE 20 SQ CM/< ICD-10 Diagnosis Description L97.824 Non-pressure chronic ulcer of other part of left lower leg wit Modifier: h necrosis of bo Quantity: 1 ne Physician Procedures CPT4 Code Description: [id number] 11042 - WC PHYS SUBQ TISS 20 SQ CM ICD-10 Diagnosis Description L97.824 Non-pressure chronic ulcer of other part of left lower leg wit Modifier: h necrosis of bon Quantity: 1 e Electronic Signature(s) Signed: 04/16/2019 7:47:21 AM By: Baltazar Najjarobson,  MD Entered By: Baltazar Najjarobson,  on 04/14/2019 09:49:13

## 2019-04-21 ENCOUNTER — Encounter: Payer: Medicare Other | Admitting: Internal Medicine

## 2019-04-28 ENCOUNTER — Encounter: Payer: Medicare Other | Admitting: Internal Medicine

## 2019-04-28 ENCOUNTER — Other Ambulatory Visit: Payer: Self-pay

## 2019-04-28 DIAGNOSIS — E11622 Type 2 diabetes mellitus with other skin ulcer: Secondary | ICD-10-CM | POA: Diagnosis not present

## 2019-04-29 ENCOUNTER — Ambulatory Visit: Payer: Medicare Other | Admitting: Internal Medicine

## 2019-04-29 NOTE — Progress Notes (Addendum)
BRAZEN, DOMANGUE ([id number]) Visit Report for 04/28/2019 Arrival Information Details Patient Name: Dylan Wright, Dylan Wright. Date of Service: 04/28/2019 8:15 AM Medical Record Number: [id number] Patient Account Number: [id number] Date of Birth/Sex: Jul 09, 1960 (58 y.o. M) Treating RN: Curtis Sites Primary Care Lovie Zarling: Lucy Chris Other Clinician: Referring Oluwadara Gorman: Lucy Chris Treating Diyari Cherne/Extender: Linwood Dibbles, HOYT Weeks in Treatment: 6 Visit Information History Since Last Visit Added or deleted any medications: No Patient Arrived: Ambulatory Any new allergies or adverse reactions: No Arrival Time: 08:15 Had a fall or experienced change in No Accompanied By: wife activities of daily living that may affect Transfer Assistance: None risk of falls: Patient Identification Verified: Yes Signs or symptoms of abuse/neglect since last visito No Secondary Verification Process Completed: Yes Hospitalized since last visit: No Implantable device outside of the clinic excluding No cellular tissue based products placed in the center since last visit: Has Dressing in Place as Prescribed: Yes Pain Present Now: No Electronic Signature(s) Signed: 04/28/2019 4:46:03 PM By: Curtis Sites Entered By: Curtis Sites on 04/28/2019 08:16:20 Dylan Wright ([id number]) -------------------------------------------------------------------------------- Clinic Level of Care Assessment Details Patient Name: Dylan Wright. Date of Service: 04/28/2019 8:15 AM Medical Record Number: [id number] Patient Account Number: [id number] Date of Birth/Sex: 1960/07/27 (58 y.o. M) Treating RN: Huel Coventry Primary Care Evangelynn Lochridge: Lucy Chris Other Clinician: Referring Adrien Shankar: Lucy Chris Treating Donie Lemelin/Extender: Altamese [place] in Treatment: 6 Clinic Level of Care Assessment Items TOOL 4 Quantity Score []  - Use when only an EandM is performed on FOLLOW-UP visit 0 ASSESSMENTS - Nursing  Assessment / Reassessment []  - Reassessment of Co-morbidities (includes updates in patient status) 0 X- 1 5 Reassessment of Adherence to Treatment Plan ASSESSMENTS - Wound and Skin Assessment / Reassessment X - Simple Wound Assessment / Reassessment - one wound 1 5 []  - 0 Complex Wound Assessment / Reassessment - multiple wounds []  - 0 Dermatologic / Skin Assessment (not related to wound area) ASSESSMENTS - Focused Assessment []  - Circumferential Edema Measurements - multi extremities 0 []  - 0 Nutritional Assessment / Counseling / Intervention []  - 0 Lower Extremity Assessment (monofilament, tuning fork, pulses) []  - 0 Peripheral Arterial Disease Assessment (using hand held doppler) ASSESSMENTS - Ostomy and/or Continence Assessment and Care []  - Incontinence Assessment and Management 0 []  - 0 Ostomy Care Assessment and Management (repouching, etc.) PROCESS - Coordination of Care X - Simple Patient / Family Education for ongoing care 1 15 []  - 0 Complex (extensive) Patient / Family Education for ongoing care []  - 0 Staff obtains , Records, Test Results / Process Orders []  - 0 Staff telephones HHA, Nursing Homes / Clarify orders / etc []  - 0 Routine Transfer to another Facility (non-emergent condition) []  - 0 Routine Hospital Admission (non-emergent condition) []  - 0 New Admissions / / Ordering NPWT, Apligraf, etc. []  - 0 Emergency Hospital Admission (emergent condition) X- 1 10 Simple Discharge Coordination DOMINIQ, FONTAINE ( ) []  - 0 Complex (extensive) Discharge Coordination PROCESS - Special Needs []  - Pediatric / Minor Patient Management 0 []  - 0 Isolation Patient Management []  - 0 Hearing / Language / Visual special needs []  - 0 Assessment of Community assistance (transportation, D/C planning, etc.) []  - 0 Additional assistance / Altered mentation []  - 0 Support Surface(s) Assessment (bed, cushion, seat,  etc.) INTERVENTIONS - Wound Cleansing / Measurement X - Simple Wound Cleansing - one wound 1 5 []  - 0 Complex Wound Cleansing - multiple wounds X- 1 5 Wound Imaging (photographs -  any number of wounds) []  - 0 Wound Tracing (instead of photographs) X- 1 5 Simple Wound Measurement - one wound []  - 0 Complex Wound Measurement - multiple wounds INTERVENTIONS - Wound Dressings []  - Small Wound Dressing one or multiple wounds 0 X- 1 15 Medium Wound Dressing one or multiple wounds []  - 0 Large Wound Dressing one or multiple wounds []  - 0 Application of Medications - topical []  - 0 Application of Medications - injection INTERVENTIONS - Miscellaneous []  - External ear exam 0 []  - 0 Specimen Collection (cultures, biopsies, blood, body fluids, etc.) []  - 0 Specimen(s) / Culture(s) sent or taken to Lab for analysis []  - 0 Patient Transfer (multiple staff / Nurse, adultHoyer Lift / Similar devices) []  - 0 Simple Staple / Suture removal (25 or less) []  - 0 Complex Staple / Suture removal (26 or more) []  - 0 Hypo / Hyperglycemic Management (close monitor of Blood Glucose) []  - 0 Ankle / Brachial Index (ABI) - do not check if billed separately X- 1 5 Vital Signs Dylan Wright, Minor D. ([id number]) Has the patient been seen at the hospital within the last three years: Yes Total Score: 70 Level Of Care: New/Established - Level 2 Electronic Signature(s) Signed: 04/29/2019 1:15:06 PM By: Elliot GurneyWoody, BSN, RN, CWS, Kim RN, BSN Entered By: Elliot GurneyWoody, BSN, RN, CWS, Kim on 04/28/2019 17:34:27 Dylan Wright, Friedrich D. ([id number]) -------------------------------------------------------------------------------- Encounter Discharge Information Details Patient Name: Dylan Wright, Dylan D. Date of Service: 04/28/2019 8:15 AM Medical Record Number: [id number] Patient Account Number: [id number] Date of Birth/Sex: 13-May-1961 (58 y.o. M) Treating RN: Huel CoventryWoody, Kim Primary Care Antion Andres: Lucy ChrisGOERES, LINDSEY Other Clinician: Referring Tami Blass:  Lucy ChrisGOERES, LINDSEY Treating Elicia Lui/Extender: Linwood DibblesSTONE III, HOYT Weeks in Treatment: 6 Encounter Discharge Information Items Discharge Condition: Stable Ambulatory Status: Ambulatory Discharge Destination: Home Transportation: Private Auto Accompanied By: self Schedule Follow-up Appointment: Yes Clinical Summary of Care: Electronic Signature(s) Signed: 04/29/2019 1:15:06 PM By: Elliot GurneyWoody, BSN, RN, CWS, Kim RN, BSN Entered By: Elliot GurneyWoody, BSN, RN, CWS, Kim on 04/28/2019 08:38:18 Dylan Wright, Jarin D. ([id number]) -------------------------------------------------------------------------------- Lower Extremity Assessment Details Patient Name: Dylan Wright, Jago D. Date of Service: 04/28/2019 8:15 AM Medical Record Number: [id number] Patient Account Number: [id number] Date of Birth/Sex: 13-May-1961 (58 y.o. M) Treating RN: Curtis Sitesorthy, Joanna Primary Care Tykiera Raven: Lucy ChrisGOERES, LINDSEY Other Clinician: Referring Shannell Mikkelsen: Lucy ChrisGOERES, LINDSEY Treating Charlane Westry/Extender: STONE III, HOYT Weeks in Treatment: 6 Edema Assessment Assessed: [Left: No] [Right: No] Edema: [Left: N] [Right: o] Vascular Assessment Pulses: Popliteal Palpable: [Left:Yes] Electronic Signature(s) Signed: 04/28/2019 4:46:03 PM By: Curtis Sitesorthy, Joanna Entered By: Curtis Sitesorthy, Joanna on 04/28/2019 08:20:16 Dylan Wright, Carnelius D. ([id number]) -------------------------------------------------------------------------------- Multi Wound Chart Details Patient Name: Dylan Wright, Tyrail D. Date of Service: 04/28/2019 8:15 AM Medical Record Number: [id number] Patient Account Number: [id number] Date of Birth/Sex: 13-May-1961 (58 y.o. M) Treating RN: Huel CoventryWoody, Kim Primary Care Maytte Jacot: Lucy ChrisGOERES, LINDSEY Other Clinician: Referring Dorian Duval: Lucy ChrisGOERES, LINDSEY Treating Kealy Lewter/Extender: Linwood DibblesSTONE III, HOYT Weeks in Treatment: 6 Vital Signs Height(in): 75 Pulse(bpm): 43 Weight(lbs): 320 Blood Pressure(mmHg): 152/78 Body Mass Index(BMI): 40 Temperature(F): 99.0 Respiratory  Rate 16 (breaths/min): Photos: [N/A:N/A] Wound Location: Left Lower Leg - Anterior N/A N/A Wounding Event: Trauma N/A N/A Primary Etiology: Diabetic Wound/Ulcer of the N/A N/A Lower Extremity Comorbid History: Hypertension, Myocardial N/A N/A Infarction, Colitis, Type II Diabetes Date Acquired: 02/09/2019 N/A N/A Weeks of Treatment: 6 N/A N/A Wound Status: Open N/A N/A Measurements L x W x D 0.3x0.2x0.2 N/A N/A (cm) Area (cm) : 0.047 N/A N/A Volume (cm) : 0.009 N/A N/A % Reduction in Area: 93.20% N/A N/A %  Reduction in Volume: 98.70% N/A N/A Classification: Grade 1 N/A N/A Exudate Amount: Medium N/A N/A Exudate Type: Serous N/A N/A Exudate Color: amber N/A N/A Wound Margin: Flat and Intact N/A N/A Granulation Amount: Large (67-100%) N/A N/A Granulation Quality: Pink N/A N/A Necrotic Amount: None Present (0%) N/A N/A Exposed Structures: Fat Layer (Subcutaneous N/A N/A Tissue) Exposed: Yes Fascia: No Tendon: No Muscle: No CLINT, BIELLO ([id number]) Joint: No Bone: No Epithelialization: Large (67-100%) N/A N/A Treatment Notes Electronic Signature(s) Signed: 04/28/2019 5:09:09 PM By: Linton Ham MD Entered By: Linton Ham on 04/28/2019 08:33:53 [name] ([id number]) -------------------------------------------------------------------------------- Multi-Disciplinary Care Plan Details Patient Name: BURT, PIATEK. Date of Service: 04/28/2019 8:15 AM Medical Record Number: [id number] Patient Account Number: [id number] Date of Birth/Sex: 12-Dec-1960 (58 y.o. M) Treating RN: Cornell Barman Primary Care Karyn Brull: Mcneil Sober Other Clinician: Referring Rianna Lukes: Mcneil Sober Treating Estephani Popper/Extender: Sharalyn Ink in Treatment: 6 Active Inactive Electronic Signature(s) Signed: 05/14/2019 1:40:14 PM By: Gretta Cool, BSN, RN, CWS, Kim RN, BSN Previous Signature: 04/29/2019 1:15:06 PM Version By: Gretta Cool, BSN, RN, CWS, Kim RN, BSN Entered By: Gretta Cool, BSN, RN,  CWS, Kim on 05/14/2019 13:40:14 [name] ([id number]) -------------------------------------------------------------------------------- Pain Assessment Details Patient Name: GARRON, ELINE. Date of Service: 04/28/2019 8:15 AM Medical Record Number: [id number] Patient Account Number: [id number] Date of Birth/Sex: 1960/08/21 (58 y.o. M) Treating RN: Montey Hora Primary Care Margo Lama: Mcneil Sober Other Clinician: Referring Cosby Proby: Mcneil Sober Treating Ellamay Fors/Extender: Melburn Hake, HOYT Weeks in Treatment: 6 Active Problems Location of Pain Severity and Description of Pain Patient Has Paino No Site Locations Pain Management and Medication Current Pain Management: Electronic Signature(s) Signed: 04/28/2019 4:46:03 PM By: Montey Hora Entered By: Montey Hora on 04/28/2019 08:16:26 [name] ([id number]) -------------------------------------------------------------------------------- Wound Assessment Details Patient Name: JAMESMICHAEL, SHADD. Date of Service: 04/28/2019 8:15 AM Medical Record Number: [id number] Patient Account Number: [id number] Date of Birth/Sex: Apr 10, 1961 (58 y.o. M) Treating RN: Montey Hora Primary Care Emmarose Klinke: Mcneil Sober Other Clinician: Referring Mychaela Lennartz: Mcneil Sober Treating Damare Serano/Extender: Melburn Hake, HOYT Weeks in Treatment: 6 Wound Status Wound Number: 1 Primary Diabetic Wound/Ulcer of the Lower Extremity Etiology: Wound Location: Left Lower Leg - Anterior Wound Status: Open Wounding Event: Trauma Comorbid Hypertension, Myocardial Infarction, Colitis, Date Acquired: 02/09/2019 History: Type II Diabetes Weeks Of Treatment: 6 Clustered Wound: No Photos Wound Measurements Length: (cm) 0.3 % Reduction i Width: (cm) 0.2 % Reduction i Depth: (cm) 0.2 Epithelializa Area: (cm) 0.047 Tunneling: Volume: (cm) 0.009 Undermining: n Area: 93.2% n Volume: 98.7% tion: Large (67-100%) No No Wound  Description Classification: Grade 1 Foul Odor Af Wound Margin: Flat and Intact Slough/Fibri Exudate Amount: Medium Exudate Type: Serous Exudate Color: amber ter Cleansing: No no Yes Wound Bed Granulation Amount: Large (67-100%) Exposed Structure Granulation Quality: Pink Fascia Exposed: No Necrotic Amount: None Present (0%) Fat Layer (Subcutaneous Tissue) Exposed: Yes Tendon Exposed: No Muscle Exposed: No Joint Exposed: No Bone Exposed: No Electronic Signature(s) ERION, WEIGHTMAN ([id number]) Signed: 04/28/2019 4:46:03 PM By: Montey Hora Signed: 04/29/2019 1:15:06 PM By: Gretta Cool, BSN, RN, CWS, Kim RN, BSN Entered By: Gretta Cool, BSN, RN, CWS, Kim on 04/28/2019 08:32:07 ERVEN, RAMSON ([id number]) -------------------------------------------------------------------------------- Vitals Details Patient Name: UGO, THOMA. Date of Service: 04/28/2019 8:15 AM Medical Record Number: [id number] Patient Account Number: [id number] Date of Birth/Sex: 1961/03/25 (58 y.o. M) Treating RN: Montey Hora Primary Care Sophira Rumler: Mcneil Sober Other Clinician: Referring Quetzal Meany: Mcneil Sober Treating Quadir Muns/Extender: STONE III, HOYT Weeks in Treatment: 6 Vital Signs Time Taken: 08:16  Temperature (F): 99.0 Height (in): 75 Pulse (bpm): 43 Weight (lbs): 320 Respiratory Rate (breaths/min): 16 Body Mass Index (BMI): 40 Blood Pressure (mmHg): 152/78 Reference Range: 80 - 120 mg / dl Electronic Signature(s) Signed: 04/28/2019 4:46:03 PM By: Curtis Sites Entered By: Curtis Sites on 04/28/2019 08:17:55

## 2019-04-30 NOTE — Progress Notes (Signed)
Dylan Wright, Dylan D. ([id number]) Visit Report for 04/28/2019 HPI Details Patient Name: Dylan Wright, Dylan D. Date of Service: 04/28/2019 8:15 AM Medical Record Number: [id number] Patient Account Number: [id number] Date of Birth/Sex: [birth year] (58 y.o. M) Treating RN: Huel CoventryWoody, Kim Primary Care Provider: Lucy ChrisGOERES, LINDSEY Other Clinician: Referring Provider: Lucy ChrisGOERES, LINDSEY Treating Provider/Extender: Linwood DibblesSTONE III, HOYT Weeks in Treatment: 6 History of Present Illness HPI Description: ADMISSION 03/17/2019 This is a 58 year old diabetic man who was cutting his lawn of roughly a month ago. A small rock spun out and hit him in the left lower leg. There was immediate superficial injury. He said the pain by that night was quite significant. Saw his primary doctor's office at [facility] in [place] apparently a culture was done and the patient was prescribed 15 days of doxycycline which he just finished 3 days ago. This is not made any difference. There is a fair amount of drainage still tenderness and aching pain. He has been only covering this with a Band-Aid. 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 X-ray did not show any bone damage. I am going to try to get a result of the culture that his primary doctor did just to ensure that it was negative. He was empirically given doxycycline. He is still complaining of a lot of pain. Using silver alginate 10/21. I was able to confirm with his primary doctor's office through my chart on the patient's phone that the original culture was negative. He only started the cephalexin 3 days ago therefore he has enough until Sunday. I think the erythema  and swelling around the wound is better. We are using silver alginate. 10/28; the wound looks somewhat better. There is less swelling and less erythema but it is certainly not gone. The wound itself is about 50% granulated there is still a probing depth from about 6-12 o'clock but it does not go to bone there is still significant tenderness medially. None of the cultures that we have done have been positive either here or in primary doctor's office. I did a single x-ray of this area that was negative. There was no bone damage or foreign object. The original injury happened when he was cutting his grass and a rock projectile hit his leg. He has received courses of doxycycline before he came here and then cephalexin that I gave him even though as mentioned there was never a positive culture. I thought the cephalexin it resulted in less erythema and less swelling but certainly not resolved it. 11/4; patient states the wound is a lot better and a lot less painful. He saw Dr. Orvan Falconerampbell who did not specifically address my concerns i.e. sporotrichosis or rapidly growing mycobacteria however I think you talk to the patient in general terms about this. He did not feel any antibiotics were denied and he did not do the specific tests largely because the patient's wound seems to be getting better. 11/18; 2-week follow-up. His wound is much improved and the pain is better. He has been using silver collagen with a foam cover. He states the coverings he gets at [facility] have been irritating his skin. Electronic Signature(s) Signed: 04/28/2019 5:09:09 PM By: Baltazar Najjarobson, Meghna Hagmann MD Signed: 04/30/2019 1:53:04 PM By: Lenda KelpStone III, Hoyt PA-C  Entered By: Baltazar Najjar on 04/28/2019 08:36:49 Dylan Galas ([id number]) -------------------------------------------------------------------------------- Physical Exam Details Patient Name: Dylan Wright, POTH. Date of Service: 04/28/2019 8:15 AM Medical Record Number:  [id number] Patient Account Number: [id number] Date of Birth/Sex: June 22, 1960 (58 y.o. M) Treating RN: Huel Coventry Primary Care Provider: Lucy Chris Other Clinician: Referring Provider: Lucy Chris Treating Provider/Extender: Linwood Dibbles, HOYT Weeks in Treatment: 6 Constitutional Patient is hypertensive.. Pulse regular and within target range for patient.Marland Kitchen Respirations regular, non-labored and within target range.. Temperature is normal and within the target range for the patient.Marland Kitchen appears in no distress. Respiratory Respiratory effort is easy and symmetric bilaterally. Rate is normal at rest and on room air.. Cardiovascular Pedal pulses palpable and strong bilaterally.. Integumentary (Hair, Skin) Erythema around the wound is essentially resolved there is no. Psychiatric No evidence of depression, anxiety, or agitation. Calm, cooperative, and communicative. Appropriate interactions and affect.. Notes Wound exam; much better looking wound erythema is almost resolved. Not nearly as tender. There is no depth. Only a small, shaped wound remains. This is too small to safely debride. I am hopeful that this will close over with continued dressing Electronic Signature(s) Signed: 04/28/2019 5:09:09 PM By: Baltazar Najjar MD Signed: 04/30/2019 1:53:04 PM By: Lenda Kelp PA-C Entered By: Baltazar Najjar on 04/28/2019 08:38:09 Dylan Galas ([id number]) -------------------------------------------------------------------------------- Physician Orders Details Patient Name: Dylan Wright, MENEES. Date of Service: 04/28/2019 8:15 AM Medical Record Number: [id number] Patient Account Number: [id number] Date of Birth/Sex: 1961-05-20 (58 y.o. 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 Anesthetic (add to Medication List) Wound #1 Left,Anterior Lower Leg o Topical Lidocaine 4% cream applied to wound bed prior to debridement (In Clinic Only). Primary Wound Dressing Wound #1 Left,Anterior Lower Leg o Silver Collagen - Pack strip lightly into wound Secondary Dressing Wound #1 Left,Anterior Lower Leg o Boardered Foam Dressing Dressing Change Frequency Wound #1 Left,Anterior Lower Leg o Change dressing every other day. Follow-up Appointments Wound #1 Left,Anterior Lower Leg o Return Appointment in 2 weeks. Edema Control Wound #1 Left,Anterior Lower Leg o Elevate legs to the level of the heart and pump ankles as often as possible Electronic Signature(s) Signed: 04/29/2019 1:15:06 PM By: Elliot Gurney, BSN, RN, CWS, Kim RN, BSN Signed: 04/30/2019 1:53:04 PM By: Lenda Kelp PA-C Entered By: Elliot Gurney, BSN, RN, CWS, Kim on 04/28/2019 08:37:50 Dylan Wright, Dylan Wright ([id number]) Dylan Wright, Dylan Wright ([id number]) -------------------------------------------------------------------------------- Problem List Details Patient Name: Dylan Wright, Dylan Wright. Date of Service: 04/28/2019 8:15 AM Medical Record Number: [id number] Patient Account Number: [id number] Date of Birth/Sex: 1961-03-16 (58 y.o. M) Treating RN: Huel Coventry Primary Care Provider: Lucy Chris Other Clinician: Referring Provider: Lucy Chris Treating Provider/Extender: Linwood Dibbles, HOYT Weeks in Treatment: 6 Active Problems ICD-10 Evaluated Encounter Code Description Active Date Today Diagnosis L97.824 Non-pressure chronic ulcer of  other part of left lower leg with 03/17/2019 No Yes necrosis of bone S81.812D Laceration without foreign body, left lower leg, subsequent 03/17/2019 No Yes encounter L03.116 Cellulitis of left lower limb 03/17/2019 No  Yes E11.622 Type 2 diabetes mellitus with other skin ulcer 03/17/2019 No Yes Inactive Problems Resolved Problems Electronic Signature(s) Signed: 04/28/2019 5:09:09 PM By: Baltazar Najjar MD Signed: 04/30/2019 1:53:04 PM By: Lenda Kelp PA-C Entered By: Baltazar Najjar on 04/28/2019 08:33:40 Rochford, Beulah Gandy ([id number]) -------------------------------------------------------------------------------- Progress Note Details Patient Name: Dylan Wright, FRIES. Date of Service: 04/28/2019 8:15 AM Medical Record Number: [id number] Patient Account Number: [id number] Date of Birth/Sex: 11/21/1960 (58 y.o. M) Treating RN: Huel Coventry Primary Care Provider: Lucy Chris Other Clinician: Referring Provider: Lucy Chris Treating Provider/Extender: Linwood Dibbles, HOYT Weeks in Treatment: 6 Subjective History of Present Illness (HPI) ADMISSION 03/17/2019 This is a 58 year old diabetic man who was cutting his lawn of roughly a month ago. A small rock spun out and hit him in the left lower leg. There was immediate superficial injury. He said the pain by that night was quite significant. Saw his primary doctor's office at [facility] in [place] apparently a culture was done and the patient was prescribed 15 days of doxycycline which he just finished 3 days ago. This is not made any difference. There is a fair amount of drainage still tenderness and aching pain. He has been only covering this with a Band-Aid. 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 X-ray did not show any bone damage. I am going to try to get a result of the culture that his primary doctor did just to ensure that it was negative. He was empirically given doxycycline. He is still complaining of a lot of pain. Using silver alginate 10/21. I was able to confirm with his primary doctor's office through my chart on the patient's phone that the original culture was negative. He only started the cephalexin 3 days ago therefore he has enough until Sunday. I think the erythema and swelling around the wound is better. We are using silver alginate. 10/28; the wound looks somewhat better. There is less swelling and less erythema but it is certainly not gone. The wound itself is about 50% granulated there is still a probing depth from about 6-12 o'clock but it does not go to bone there is still significant tenderness medially. None of the cultures that we have done have been positive either here or in primary doctor's office. I did a single x-ray of this area that was negative. There was no bone damage or foreign object. The original injury happened when he was cutting his grass and a rock projectile hit his leg. He has received courses of doxycycline before he came here and then cephalexin that I gave him even though as mentioned there was never a positive culture. I thought the cephalexin it resulted in less erythema and less swelling but certainly not resolved it. 11/4; patient states the wound is a lot better and a lot less painful. He saw Dr. Orvan Falconer who did not specifically address my concerns i.e. sporotrichosis or rapidly growing mycobacteria however I think you talk to the patient in general terms about this. He did not feel any antibiotics were denied and he did not do the specific tests largely because the patient's wound seems to be getting better. 11/18; 2-week follow-up. His wound is much  improved and the pain is better.  He has been using silver collagen with a foam cover. He states the coverings he gets at [facility] have been irritating his skin. Objective Constitutional Dylan Wright, Dylan Wright ([id number]) Patient is hypertensive.. Pulse regular and within target range for patient.Marland Kitchen Respirations regular, non-labored and within target range.. Temperature is normal and within the target range for the patient.Marland Kitchen appears in no distress. Vitals Time Taken: 8:16 AM, Height: 75 in, Weight: 320 lbs, BMI: 40, Temperature: 99.0 F, Pulse: 43 bpm, Respiratory Rate: 16 breaths/min, Blood Pressure: 152/78 mmHg. Respiratory Respiratory effort is easy and symmetric bilaterally. Rate is normal at rest and on room air.. Cardiovascular Pedal pulses palpable and strong bilaterally.Marland Kitchen Psychiatric No evidence of depression, anxiety, or agitation. Calm, cooperative, and communicative. Appropriate interactions and affect.. General Notes: Wound exam; much better looking wound erythema is almost resolved. Not nearly as tender. There is no depth. Only a small, shaped wound remains. This is too small to safely debride. I am hopeful that this will close over with continued dressing Integumentary (Hair, Skin) Erythema around the wound is essentially resolved there is no. Wound #1 status is Open. Original cause of wound was Trauma. The wound is located on the Left,Anterior Lower Leg. The wound measures 0.3cm length x 0.2cm width x 0.2cm depth; 0.047cm^2 area and 0.009cm^3 volume. There is Fat Layer (Subcutaneous Tissue) Exposed exposed. There is no tunneling or undermining noted. There is a medium amount of serous drainage noted. The wound margin is flat and intact. There is large (67-100%) pink granulation within the wound bed. There is no necrotic tissue within the wound bed. 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 Anesthetic (add to Medication List): Wound #1 Left,Anterior Lower Leg: Topical Lidocaine 4% cream applied to wound bed prior to debridement (In Clinic Only). Primary Wound Dressing: Wound #1 Left,Anterior Lower Leg: Dylan Wright, Dylan Wright ([id number]) Silver Collagen - Pack strip lightly into wound Secondary Dressing: Wound #1 Left,Anterior Lower Leg: Boardered Foam Dressing Dressing Change Frequency: Wound #1 Left,Anterior Lower Leg: Change dressing every other day. Follow-up Appointments: Wound #1 Left,Anterior Lower Leg: Return Appointment in 2 weeks. Edema Control: Wound #1 Left,Anterior Lower Leg: Elevate legs to the level of the heart and pump ankles as often as possible 1Silver collagen border foam 2. I told him to come and follow with [place] in 2 weeks. If this is healed he can call us and cancel. 3. No evidence of infection no reason for additional antibiotics Electronic Signature(s) Signed: 04/28/2019 5:09:09 PM By: Baltazar Najjar MD Signed: 04/30/2019 1:53:04 PM By: Lenda Kelp PA-C Entered By: Baltazar Najjar on 04/28/2019 08:39:00 Dylan Galas ([id number]) -------------------------------------------------------------------------------- SuperBill Details Patient Name: Dylan Wright, ARKWRIGHT. Date of Service: 04/28/2019 Medical Record Number: [id number] Patient Account Number: [id number] Date of Birth/Sex: Aug 03, 1960 (58 y.o. M) Treating RN: Huel Coventry Primary Care Provider: Lucy Chris Other Clinician: Referring Provider: Lucy Chris Treating Provider/Extender: Linwood Dibbles, HOYT Weeks in Treatment: 6 Diagnosis Coding ICD-10 Codes Code Description [phone] Non-pressure chronic ulcer of other part of left lower leg with  necrosis of bone S81.812D Laceration without foreign body, left lower leg, subsequent encounter L03.116 Cellulitis of left lower limb E11.622 Type 2 diabetes mellitus with other skin ulcer Facility Procedures  CPT4 Code: [id number] Description: 45364 - WOUND CARE VISIT-LEV 2 EST PT Modifier: Quantity: 1 Physician Procedures CPT4 Code Description: [id number] 24825 - WC PHYS LEVEL 3 - EST PT ICD-10 Diagnosis Description L97.824 Non-pressure chronic ulcer of other part of left lower leg wi S81.812D Laceration without foreign body, left lower leg, subsequent e L03.116 Cellulitis  of left lower limb E11.622 Type 2 diabetes mellitus with other skin ulcer Modifier: th necrosis of bon ncounter Quantity: 1 e Electronic Signature(s) Signed: 04/28/2019 5:34:36 PM By: Gretta Cool, BSN, RN, CWS, Kim RN, BSN Signed: 04/30/2019 1:53:04 PM By: Worthy Keeler PA-C Previous Signature: 04/28/2019 5:09:09 PM Version By: Linton Ham MD Entered By: Gretta Cool, BSN, RN, CWS, Kim on 04/28/2019 17:34:36

## 2019-05-12 ENCOUNTER — Ambulatory Visit: Payer: Medicare Other | Admitting: Internal Medicine

## 2020-01-17 ENCOUNTER — Ambulatory Visit: Payer: Medicare Other | Attending: Internal Medicine

## 2020-01-17 DIAGNOSIS — Z23 Encounter for immunization: Secondary | ICD-10-CM

## 2020-01-17 NOTE — Progress Notes (Signed)
   Covid-19 Vaccination Clinic  Name:  Dylan Wright    MRN: [id number] DOB: 1960/11/12  01/17/2020  [name] was observed post Covid-19 immunization for 15 minutes without incident. He was provided with Vaccine Information Sheet and instruction to access the V-Safe system.   [name] was instructed to call 911 with any severe reactions post vaccine: Marland Kitchen Difficulty breathing  . Swelling of face and throat  . A fast heartbeat  . A bad rash all over body  . Dizziness and weakness   Immunizations Administered    Name Date Dose VIS Date Route   Pfizer COVID-19 Vaccine 01/17/2020  4:38 PM 0.3 mL 08/04/2018 Intramuscular   Manufacturer: [employer], [employer]   Lot: [id number]   NDC: [address]-7

## 2020-02-07 ENCOUNTER — Ambulatory Visit: Payer: Medicare Other

## 2020-08-13 IMAGING — CR DG TIBIA/FIBULA 2V*L*
1 series · 4 of 4 positions shown · non-contrast
Comparison: None.

CLINICAL DATA: Nonhealing open wound left tib fib

EXAM:
LEFT TIBIA AND FIBULA - 2 VIEW

[Series 1: dg tibia/fibula left · 0.14mm/px · 4 of 4 slices shown]
[im 1/4]
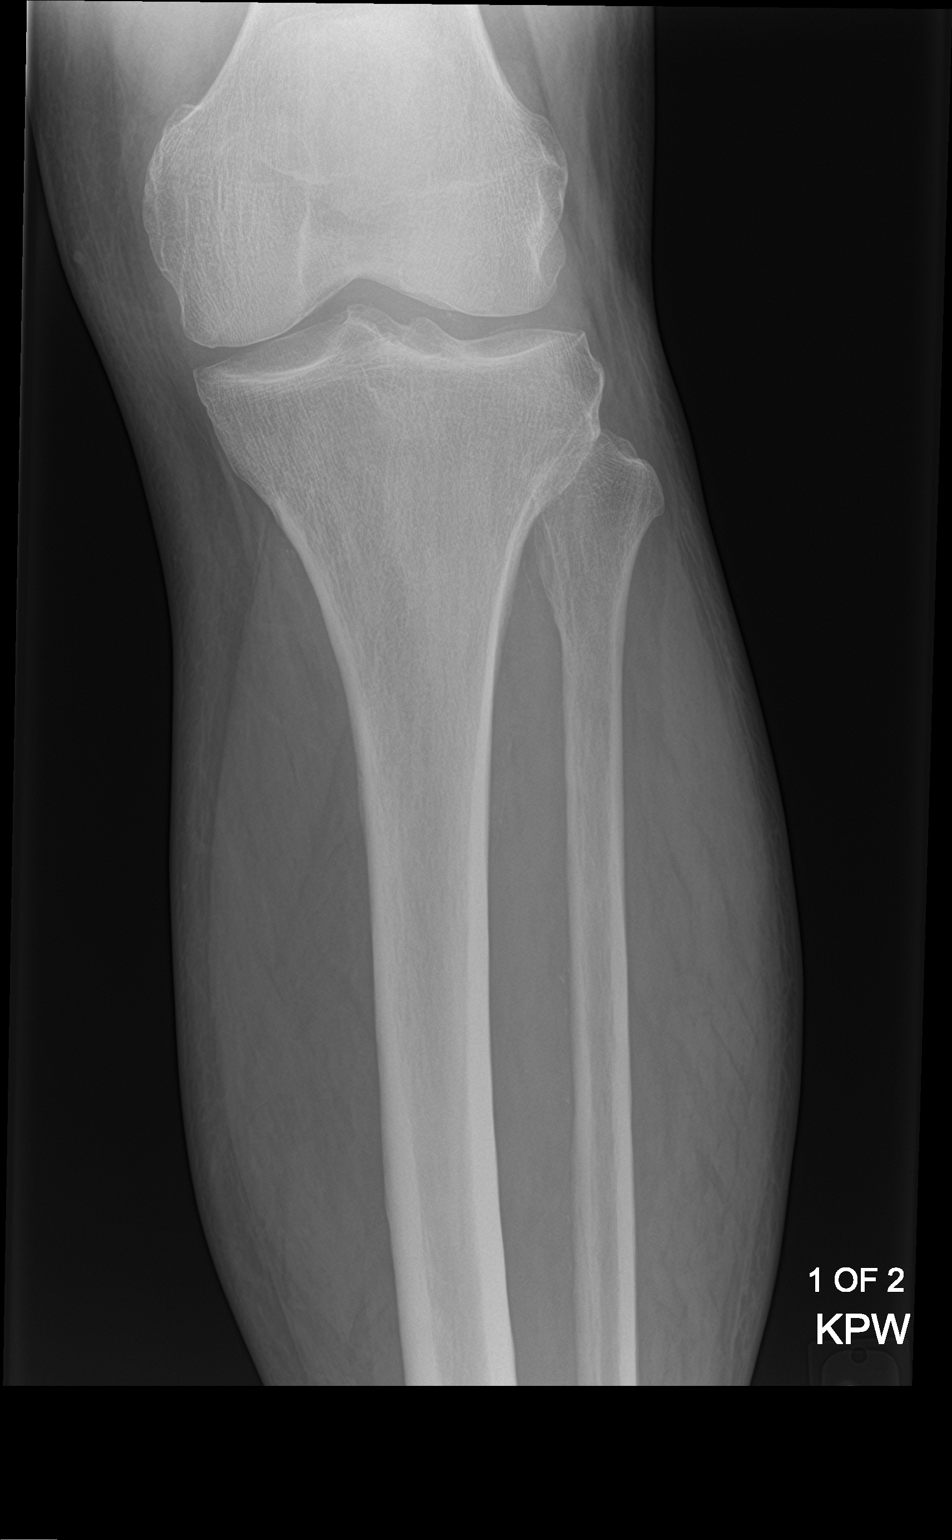
[im 2/4]
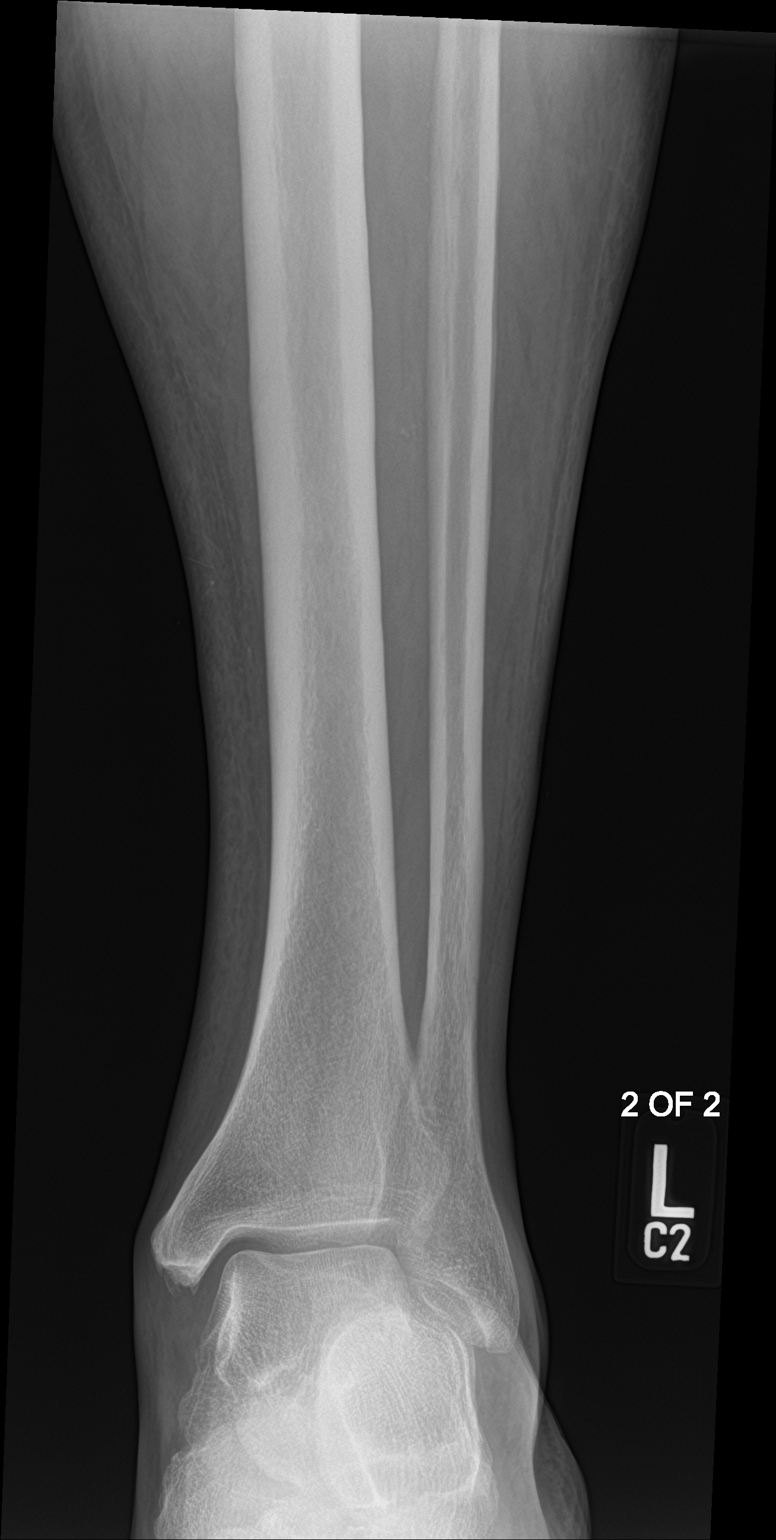
[im 3/4]
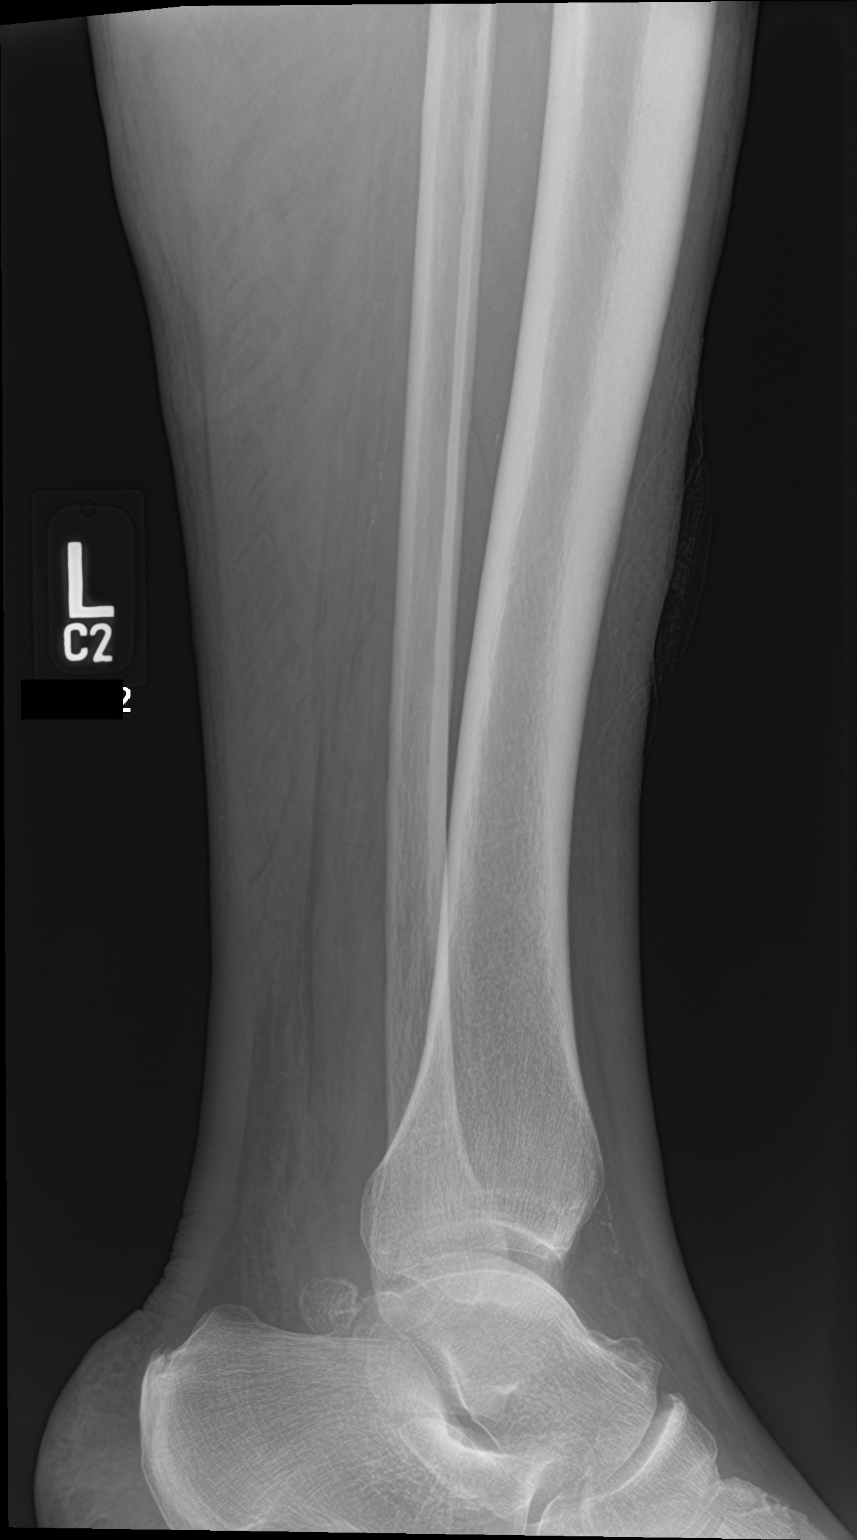
[im 4/4]
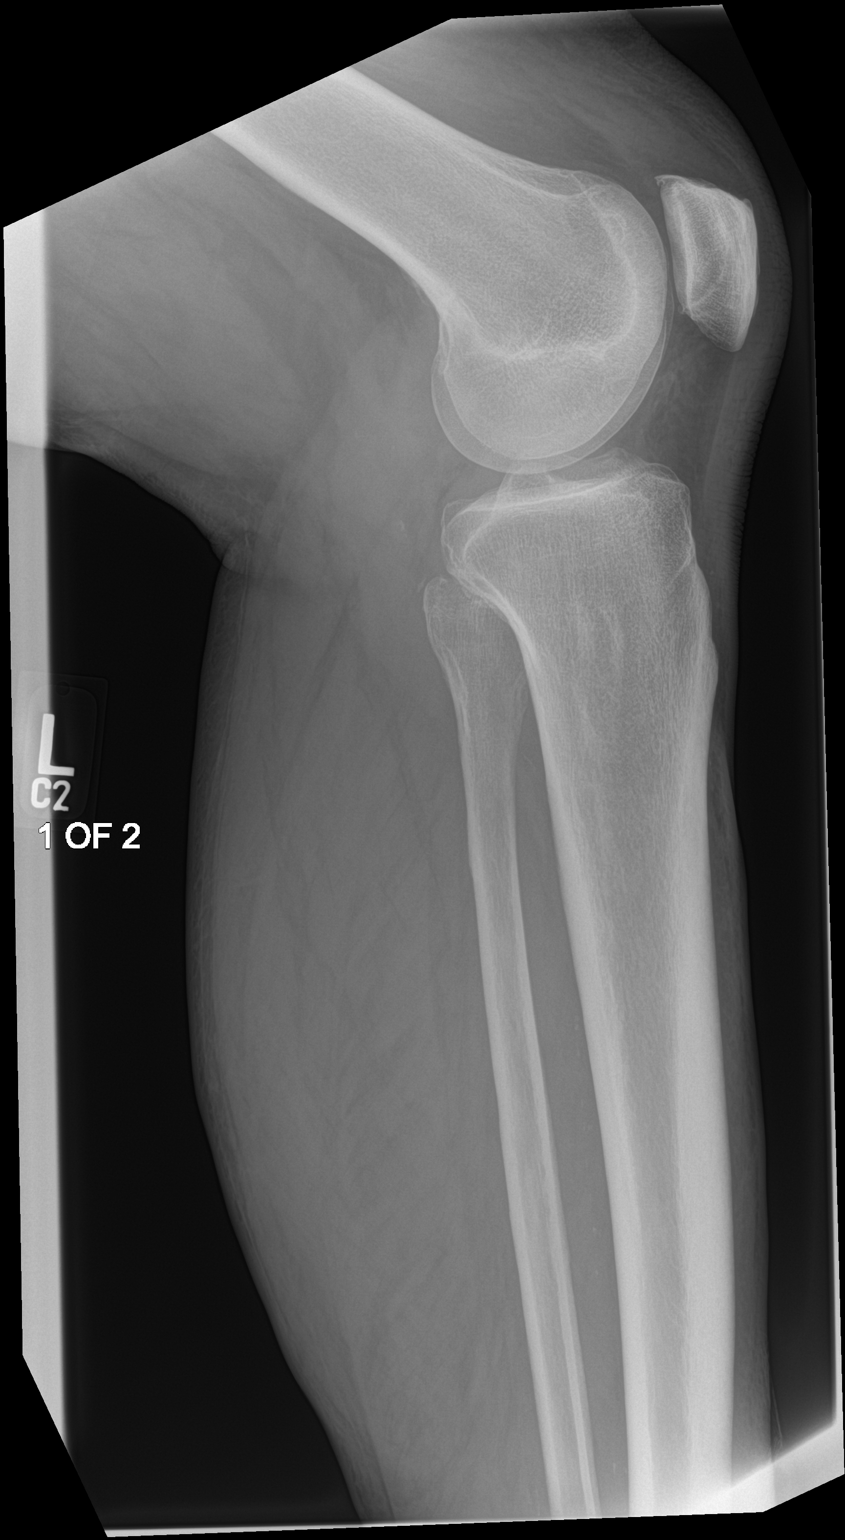

[4 of 4 positions shown; findings below may reference images not displayed]

FINDINGS: No acute bony abnormality. No fracture or evidence of osteomyelitis.
No radiopaque foreign bodies or gas within the soft tissues.
IMPRESSION: No acute findings.

## 2023-04-21 ENCOUNTER — Other Ambulatory Visit: Payer: Self-pay

## 2023-04-21 ENCOUNTER — Emergency Department
Admission: EM | Admit: 2023-04-21 | Discharge: 2023-04-21 | Disposition: A | Discharge status: Home / Self Care | Payer: Medicare Other | Attending: Emergency Medicine | Admitting: Emergency Medicine

## 2023-04-21 ENCOUNTER — Encounter: Payer: Self-pay | Admitting: Emergency Medicine

## 2023-04-21 DIAGNOSIS — E119 Type 2 diabetes mellitus without complications: Secondary | ICD-10-CM | POA: Diagnosis not present

## 2023-04-21 DIAGNOSIS — I10 Essential (primary) hypertension: Secondary | ICD-10-CM | POA: Diagnosis not present

## 2023-04-21 DIAGNOSIS — R197 Diarrhea, unspecified: Secondary | ICD-10-CM | POA: Diagnosis present

## 2023-04-21 DIAGNOSIS — Z7901 Long term (current) use of anticoagulants: Secondary | ICD-10-CM | POA: Diagnosis not present

## 2023-04-21 DIAGNOSIS — E860 Dehydration: Secondary | ICD-10-CM | POA: Diagnosis not present

## 2023-04-21 LAB — CBC
HCT: 36.9 % — ABNORMAL LOW (ref 39.0–52.0)
Hemoglobin: 12 g/dL — ABNORMAL LOW (ref 13.0–17.0)
MCH: 28.6 pg (ref 26.0–34.0)
MCHC: 32.5 g/dL (ref 30.0–36.0)
MCV: 87.9 fL (ref 80.0–100.0)
Platelets: 332 10*3/uL (ref 150–400)
RBC: 4.2 MIL/uL — ABNORMAL LOW (ref 4.22–5.81)
RDW: 12.3 % (ref 11.5–15.5)
WBC: 7.1 10*3/uL (ref 4.0–10.5)
nRBC: 0 % (ref 0.0–0.2)

## 2023-04-21 LAB — URINALYSIS, ROUTINE W REFLEX MICROSCOPIC
Bilirubin Urine: NEGATIVE
Glucose, UA: NEGATIVE mg/dL
Ketones, ur: NEGATIVE mg/dL
Leukocytes,Ua: NEGATIVE
Nitrite: NEGATIVE
Protein, ur: 100 mg/dL — AB
Specific Gravity, Urine: 1.023 (ref 1.005–1.030)
pH: 5 (ref 5.0–8.0)

## 2023-04-21 LAB — BASIC METABOLIC PANEL
Anion gap: 6 (ref 5–15)
BUN: 12 mg/dL (ref 6–20)
CO2: 25 mmol/L (ref 22–32)
Calcium: 8.3 mg/dL — ABNORMAL LOW (ref 8.9–10.3)
Chloride: 106 mmol/L (ref 98–111)
Creatinine, Ser: 1.13 mg/dL (ref 0.61–1.24)
GFR, Estimated: >60 mL/min (ref >60)
Glucose, Bld: 253 mg/dL — ABNORMAL HIGH (ref 70–99)
Potassium: 3.5 mmol/L (ref 3.5–5.1)
Sodium: 137 mmol/L (ref 135–145)

## 2023-04-21 LAB — MAGNESIUM: Magnesium: 1.7 mg/dL (ref 1.7–2.4)

## 2023-04-21 MED ORDER — ONDANSETRON 4 MG PO TBDP: 4.0000 mg | ORAL_TABLET | Freq: Three times a day (TID) | ORAL | 0 refills | Status: AC | PRN

## 2023-04-21 MED ORDER — SODIUM CHLORIDE 0.9 % IV BOLUS
500.0000 mL | Freq: Once | INTRAVENOUS | Status: AC
Start: 2023-04-21 — End: 2023-04-21
  Administered 2023-04-21: 500 mL via INTRAVENOUS

## 2023-04-21 NOTE — ED Triage Notes (Signed)
Patient to ED via POV for hypotension- 80/50 at home. States when standing he feels like he is going to pass out. Did not take BP meds this AM.

## 2023-04-21 NOTE — ED Provider Notes (Signed)
[facility] Provider Note    Event Date/Time   First MD Initiated Contact with Patient 04/21/23 1506     (approximate)   History   Hypotension   HPI  Dylan Wright is a 62 y.o. male past medical history significant for hypertension, OSA, thyroid disease, GERD, anemia, diabetes, atrial fibrillation on Eliquis, who presents to the emergency department following an episode of hypotension.  Patient states that he was evaluated last week after he was having nausea, vomiting, abdominal pain and diarrhea.  Was evaluated by his primary care provider and given the diagnosis of diverticulitis and started on Augmentin.  States that he started his Augmentin on Friday.  Was doing okay until today/late last night when he started having multiple episodes of diarrhea.  States that he has had copious amounts of watery diarrhea since that time.  Today when he got up started to feel very lightheaded and like he was going to pass out.  Denies any ongoing abdominal pain.  No fever.  No nausea or vomiting at this time.  No history of C. difficile.  No recent travel.  Denies any blood in or in his stool or melena.     Physical Exam   Triage Vital Signs: ED Triage Vitals  Encounter Vitals Group     BP 04/21/23 1207 (!) 130/98     Systolic BP Percentile --      Diastolic BP Percentile --      Pulse Rate 04/21/23 1207 77     Resp 04/21/23 1207 18     Temp 04/21/23 1207 97.9 F (36.6 C)     Temp Source 04/21/23 1207 Oral     SpO2 04/21/23 1207 96 %     Weight 04/21/23 1206 264 lb (119.7 kg)     Height 04/21/23 1206 6\' 3"  (1.905 m)     Head Circumference --      Peak Flow --      Pain Score 04/21/23 1206 0     Pain Loc --      Pain Education --      Exclude from Growth Chart --     Most recent vital signs: Vitals:   04/21/23 1612 04/21/23 1615  BP: (!) 128/90 128/89  Pulse: 70 70  Resp: 18 18  Temp: 98 F (36.7 C) 98 F (36.7 C)  SpO2:  97%    Physical  Exam Constitutional:      Appearance: He is well-developed.  HENT:     Head: Atraumatic.  Eyes:     Conjunctiva/sclera: Conjunctivae normal.  Cardiovascular:     Rate and Rhythm: Regular rhythm.  Pulmonary:     Effort: No respiratory distress.  Abdominal:     Tenderness: There is no abdominal tenderness.  Musculoskeletal:        General: No swelling.     Cervical back: Normal range of motion.  Skin:    General: Skin is warm.     Capillary Refill: Capillary refill takes 2 to 3 seconds.  Neurological:     Mental Status: He is alert. Mental status is at baseline.     IMPRESSION / MDM / ASSESSMENT AND PLAN / ED COURSE  I reviewed the triage vital signs and the nursing notes.  Differential diagnosis including C. difficile, infectious diarrhea, dehydration, electrolyte abnormality, orthostatic hypotension  EKG  I, Corena Herter, the attending physician, personally viewed and interpreted this ECG.  Atrial fibrillation with a rate of 84.  Narrow complex.  QTc 44.  Nonspecific ST changes.  Significant artifact to V1 and V2.   LABS (all labs ordered are listed, but only abnormal results are displayed) Labs interpreted as -    Labs Reviewed  BASIC METABOLIC PANEL - Abnormal; Notable for the following components:      Result Value   Glucose, Bld 253 (*)    Calcium 8.3 (*)    All other components within normal limits  CBC - Abnormal; Notable for the following components:   RBC 4.20 (*)    Hemoglobin 12.0 (*)    HCT 36.9 (*)    All other components within normal limits  C DIFFICILE QUICK SCREEN W PCR REFLEX    GASTROINTESTINAL PANEL BY PCR, STOOL (REPLACES STOOL CULTURE)  MAGNESIUM  URINALYSIS, ROUTINE W REFLEX MICROSCOPIC  CBG MONITORING, ED     MDM  GI pathogen panel and C. difficile ordered.  No leukocytosis.  Anemia but hemoglobin stable at 12.0.  Hyperglycemia but does not meet criteria for DKA.  Given a 500 bolus of IV fluids and will reevaluate.  Given oral  hydration.  On reevaluation patient states that he is feeling much better.  Orthostatic blood pressures are negative.  Stable blood pressure while standing and does not feel lightheaded or have any symptoms.  Patient unable to provide a stool sample while in the emergency department.  Have a low suspicion for C. difficile given no stool sample provided.  Did give his supplies if needed to collect at home and take to his primary care physician.  Patient without leukocytosis and no lower abdominal tenderness to palpation have a low suspicion for diverticular abscess or acute diverticulitis at this time.  Discussed staying hydrated and returning to the emergency department for any worsening symptoms.  Discussed follow-up with his primary care physician for his acute symptoms and discussed follow-up with cardiology for his A-fib.      PROCEDURES:  Critical Care performed: No  Procedures  Patient's presentation is most consistent with acute presentation with potential threat to life or bodily function.   MEDICATIONS ORDERED IN ED: Medications  sodium chloride 0.9 % bolus 500 mL (0 mLs Intravenous Stopped 04/21/23 1738)    FINAL CLINICAL IMPRESSION(S) / ED DIAGNOSES   Final diagnoses:  Dehydration  Diarrhea, unspecified type     Rx / DC Orders   ED Discharge Orders          Ordered    ondansetron (ZOFRAN-ODT) 4 MG disintegrating tablet  Every 8 hours PRN        04/21/23 1749             Note:  This document was prepared using Dragon voice recognition software and may include unintentional dictation errors.   Corena Herter, MD 04/21/23 1750

## 2023-04-21 NOTE — ED Notes (Signed)
See triage note  Presents with family  States he has been on antibiotics  but developed diarrhea yesterday

## 2023-04-21 NOTE — Discharge Instructions (Signed)
You were seen in the emergency department following an episode of diarrhea with low blood pressure.  You received IV fluids in the emergency department.  Your blood pressure improved and you are feeling better.  You were unable to provide a stool sample to check for C. difficile while you are in the emergency department.  You are given supplies so if you are able to provide a sample at home you can call and see about taking it into your primary care physician.  Stay hydrated and drink plenty of fluids.  Return to the emergency department for symptoms of dehydration, fever or abdominal pain.  Call and follow-up closely with your primary care physician.  Follow-up with cardiology for your atrial fibrillation.  zofran (ondansetron) - nausea medication, take 1 tablet every 8 hours as needed for nausea/vomiting.  Thank you for choosing [place] for your health care, it was my pleasure to care for you today!  Corena Herter, MD
